# Patient Record
Sex: Male | Born: 1970 | Race: Black or African American | Hispanic: No | Smoking: Never smoker
Health system: Southern US, Community
[De-identification: ages and names within clinical notes are randomized; demographics above are authoritative.]

## PROBLEM LIST (undated history)

## (undated) DIAGNOSIS — E119 Type 2 diabetes mellitus without complications: Secondary | ICD-10-CM

## (undated) DIAGNOSIS — E785 Hyperlipidemia, unspecified: Secondary | ICD-10-CM

## (undated) DIAGNOSIS — E079 Disorder of thyroid, unspecified: Secondary | ICD-10-CM

## (undated) HISTORY — DX: Type 2 diabetes mellitus without complications: E11.9

## (undated) HISTORY — DX: Hyperlipidemia, unspecified: E78.5

## (undated) HISTORY — DX: Disorder of thyroid, unspecified: E07.9

## (undated) HISTORY — PX: EYE SURGERY: SHX253

---

## 2012-02-24 DIAGNOSIS — N529 Male erectile dysfunction, unspecified: Secondary | ICD-10-CM | POA: Insufficient documentation

## 2012-02-24 DIAGNOSIS — E785 Hyperlipidemia, unspecified: Secondary | ICD-10-CM | POA: Insufficient documentation

## 2013-03-20 ENCOUNTER — Encounter: Payer: BC Managed Care – PPO | Attending: Family Medicine | Admitting: *Deleted

## 2013-03-20 ENCOUNTER — Encounter: Payer: Self-pay | Admitting: *Deleted

## 2013-03-20 VITALS — Ht 72.0 in | Wt 181.3 lb

## 2013-03-20 DIAGNOSIS — E119 Type 2 diabetes mellitus without complications: Secondary | ICD-10-CM | POA: Insufficient documentation

## 2013-03-20 DIAGNOSIS — Z713 Dietary counseling and surveillance: Secondary | ICD-10-CM | POA: Insufficient documentation

## 2013-03-20 DIAGNOSIS — IMO0001 Reserved for inherently not codable concepts without codable children: Secondary | ICD-10-CM

## 2013-03-20 NOTE — Patient Instructions (Addendum)
Plan:  Aim for 4 Carb Choices per meal (60 grams) +/- 1 either way  Aim for 0-2 Carbs per snack if hungry  Continue with your activity level daily as tolerated Consider taking your Lantus at same time in the evenings Check with your MD about taking Humalog to correct high BGs.

## 2013-03-20 NOTE — Progress Notes (Signed)
  Medical Nutrition Therapy:  Appt start time: 1700 end time:  1800.  Assessment:  Primary concerns today: diabetes. Here with his wife Tiffany. Up at 3 AM, works from 4 AM to 3 PM, then works around [employer], ends his day by 7 PM. Wife shops and prepares the meals.  Daughter lives there too, and enjoys cooking. SMBG occasionally, stated typical range is between 46 - 480 mg/dl. Very physically active with his work. States no previous diabetes education.  MEDICATIONS: see list   DIETARY INTAKE:  Usual eating pattern includes 1-2 meals and 0-1 snacks per day.  Everyday foods include good variety of all food groups     Avoided foods include usually fried foods.    24-hr recall:  B ( AM): uses crackers, fruit and soda to try and prevent low BG at work when he takes his medicine  Snk ( AM): none  L ( PM): brings or buys: left overs meat, starch, vegetables, OR fast food burger or chicken or sub, small fries, sweet tea Snk ( PM): not unless low BG D ( PM): meat, starch, vegetable usually, water, sweet tea, diet soda Snk ( PM): none Beverages: anything  Usual physical activity: very active with work as Geneticist, molecular  Estimated energy needs: 2000 calories 225 g carbohydrates 150 g protein 56 g fat  Progress Towards Goal(s):  In progress.   Nutritional Diagnosis:  NB-1.1 Food and nutrition-related knowledge deficit As related to control of diabetes.  As evidenced by A1c of 12.5% on 11/01/2012.    Intervention:  Nutrition counseling and diabetes education initiated. Discussed basic physiology of diabetes, SMBG and rationale of checking BG at alternate times of day, A1c, Carb Counting and reading food labels, and benefits of increased activity. Also discussed insulin action and recommended he take his Lantus at about the same time each evening for more stable BG control. Also suggest he discuss adding Humalog insulin at meal time to bring post prandial BGs down. Plan:  Aim for 4 Carb  Choices per meal (60 grams) +/- 1 either way  Aim for 0-2 Carbs per snack if hungry  Continue with your activity level daily as tolerated Consider taking your Lantus at same time in the evenings Check with your MD about taking Humalog to correct high BGs.   Handouts given during visit include: Living Well with Diabetes Carb Counting and Food Label handouts Meal Plan Card  Insulin Action handout  Monitoring/Evaluation:  Dietary intake, exercise, SMBG, and body weight in 5 week(s).

## 2013-03-29 ENCOUNTER — Encounter: Payer: Self-pay | Admitting: *Deleted

## 2013-04-28 ENCOUNTER — Ambulatory Visit: Payer: BC Managed Care – PPO | Admitting: *Deleted

## 2014-01-20 ENCOUNTER — Encounter (HOSPITAL_COMMUNITY): Payer: Self-pay | Admitting: Emergency Medicine

## 2014-01-20 ENCOUNTER — Emergency Department (HOSPITAL_COMMUNITY)
Admission: EM | Admit: 2014-01-20 | Discharge: 2014-01-20 | Disposition: A | Discharge status: Home / Self Care | Payer: BC Managed Care – PPO | Attending: Emergency Medicine | Admitting: Emergency Medicine

## 2014-01-20 DIAGNOSIS — Z794 Long term (current) use of insulin: Secondary | ICD-10-CM | POA: Insufficient documentation

## 2014-01-20 DIAGNOSIS — S46909A Unspecified injury of unspecified muscle, fascia and tendon at shoulder and upper arm level, unspecified arm, initial encounter: Secondary | ICD-10-CM | POA: Insufficient documentation

## 2014-01-20 DIAGNOSIS — IMO0002 Reserved for concepts with insufficient information to code with codable children: Secondary | ICD-10-CM | POA: Insufficient documentation

## 2014-01-20 DIAGNOSIS — Z79899 Other long term (current) drug therapy: Secondary | ICD-10-CM | POA: Insufficient documentation

## 2014-01-20 DIAGNOSIS — Y92410 Unspecified street and highway as the place of occurrence of the external cause: Secondary | ICD-10-CM | POA: Insufficient documentation

## 2014-01-20 DIAGNOSIS — E785 Hyperlipidemia, unspecified: Secondary | ICD-10-CM | POA: Insufficient documentation

## 2014-01-20 DIAGNOSIS — E119 Type 2 diabetes mellitus without complications: Secondary | ICD-10-CM | POA: Insufficient documentation

## 2014-01-20 DIAGNOSIS — E079 Disorder of thyroid, unspecified: Secondary | ICD-10-CM | POA: Insufficient documentation

## 2014-01-20 DIAGNOSIS — T148XXA Other injury of unspecified body region, initial encounter: Secondary | ICD-10-CM

## 2014-01-20 DIAGNOSIS — R110 Nausea: Secondary | ICD-10-CM | POA: Insufficient documentation

## 2014-01-20 DIAGNOSIS — Y9389 Activity, other specified: Secondary | ICD-10-CM | POA: Insufficient documentation

## 2014-01-20 DIAGNOSIS — S4980XA Other specified injuries of shoulder and upper arm, unspecified arm, initial encounter: Secondary | ICD-10-CM | POA: Insufficient documentation

## 2014-01-20 LAB — URINALYSIS, ROUTINE W REFLEX MICROSCOPIC
Glucose, UA: 100 mg/dL — AB
HGB URINE DIPSTICK: NEGATIVE
Ketones, ur: NEGATIVE mg/dL
Leukocytes, UA: NEGATIVE
NITRITE: NEGATIVE
PROTEIN: NEGATIVE mg/dL
SPECIFIC GRAVITY, URINE: 1.023 (ref 1.005–1.030)
UROBILINOGEN UA: 1 mg/dL (ref 0.0–1.0)
pH: 5 (ref 5.0–8.0)

## 2014-01-20 MED ORDER — OXYCODONE-ACETAMINOPHEN 5-325 MG PO TABS
1.0000 | ORAL_TABLET | Freq: Once | ORAL | Status: AC
  Administered 2014-01-20: 1 via ORAL
  Filled 2014-01-20: qty 1

## 2014-01-20 MED ORDER — IBUPROFEN 800 MG PO TABS: 800.0000 mg | ORAL_TABLET | Freq: Three times a day (TID) | ORAL | Status: DC

## 2014-01-20 MED ORDER — CYCLOBENZAPRINE HCL 10 MG PO TABS: 10.0000 mg | ORAL_TABLET | Freq: Two times a day (BID) | ORAL | Status: DC | PRN

## 2014-01-20 MED ORDER — OXYCODONE-ACETAMINOPHEN 5-325 MG PO TABS: 1.0000 | ORAL_TABLET | ORAL | Status: DC | PRN
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
## 2014-01-25 DIAGNOSIS — E785 Hyperlipidemia, unspecified: Secondary | ICD-10-CM | POA: Insufficient documentation

## 2014-01-25 DIAGNOSIS — E079 Disorder of thyroid, unspecified: Secondary | ICD-10-CM | POA: Insufficient documentation

## 2014-01-25 DIAGNOSIS — S335XXA Sprain of ligaments of lumbar spine, initial encounter: Secondary | ICD-10-CM | POA: Insufficient documentation

## 2014-01-25 DIAGNOSIS — E119 Type 2 diabetes mellitus without complications: Secondary | ICD-10-CM | POA: Insufficient documentation

## 2014-01-25 DIAGNOSIS — Z794 Long term (current) use of insulin: Secondary | ICD-10-CM | POA: Insufficient documentation

## 2014-01-25 DIAGNOSIS — Y92410 Unspecified street and highway as the place of occurrence of the external cause: Secondary | ICD-10-CM | POA: Insufficient documentation

## 2014-01-25 DIAGNOSIS — Y9389 Activity, other specified: Secondary | ICD-10-CM | POA: Insufficient documentation

## 2014-01-25 DIAGNOSIS — Z79899 Other long term (current) drug therapy: Secondary | ICD-10-CM | POA: Insufficient documentation
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
## 2014-01-26 ENCOUNTER — Emergency Department (HOSPITAL_COMMUNITY)
Admission: EM | Admit: 2014-01-26 | Discharge: 2014-01-26 | Disposition: A | Discharge status: Home / Self Care | Payer: BC Managed Care – PPO | Attending: Emergency Medicine | Admitting: Emergency Medicine

## 2014-01-26 ENCOUNTER — Encounter (HOSPITAL_COMMUNITY): Payer: Self-pay | Admitting: Emergency Medicine

## 2014-01-26 ENCOUNTER — Emergency Department (HOSPITAL_COMMUNITY): Payer: BC Managed Care – PPO

## 2014-01-26 DIAGNOSIS — S39012A Strain of muscle, fascia and tendon of lower back, initial encounter: Secondary | ICD-10-CM

## 2014-01-26 NOTE — Discharge Instructions (Signed)
Back Pain, Adult Low back pain is very common. About 1 in 5 people have back pain.The cause of low back pain is rarely dangerous. The pain often gets better over time.About half of people with a sudden onset of back pain feel better in just 2 weeks. About 8 in 10 people feel better by 6 weeks.  CAUSES Some common causes of back pain include:  Strain of the muscles or ligaments supporting the spine.  Wear and tear (degeneration) of the spinal discs.  Arthritis.  Direct injury to the back. DIAGNOSIS Most of the time, the direct cause of low back pain is not known.However, back pain can be treated effectively even when the exact cause of the pain is unknown.Answering your caregiver's questions about your overall health and symptoms is one of the most accurate ways to make sure the cause of your pain is not dangerous. If your caregiver needs more information, he or she may order lab work or imaging tests (X-rays or MRIs).However, even if imaging tests show changes in your back, this usually does not require surgery. HOME CARE INSTRUCTIONS For many people, back pain returns.Since low back pain is rarely dangerous, it is often a condition that people can learn to manageon their own.   Remain active. It is stressful on the back to sit or stand in one place. Do not sit, drive, or stand in one place for more than 30 minutes at a time. Take short walks on level surfaces as soon as pain allows.Try to increase the length of time you walk each day.  Do not stay in bed.Resting more than 1 or 2 days can delay your recovery.  Do not avoid exercise or work.Your body is made to move.It is not dangerous to be active, even though your back may hurt.Your back will likely heal faster if you return to being active before your pain is gone.  Pay attention to your body when you bend and lift. Many people have less discomfortwhen lifting if they bend their knees, keep the load close to their bodies,and  avoid twisting. Often, the most comfortable positions are those that put less stress on your recovering back.  Find a comfortable position to sleep. Use a firm mattress and lie on your side with your knees slightly bent. If you lie on your back, put a pillow under your knees.  Only take over-the-counter or prescription medicines as directed by your caregiver. Over-the-counter medicines to reduce pain and inflammation are often the most helpful.Your caregiver may prescribe muscle relaxant drugs.These medicines help dull your pain so you can more quickly return to your normal activities and healthy exercise.  Put ice on the injured area.  Put ice in a plastic bag.  Place a towel between your skin and the bag.  Leave the ice on for 15-20 minutes, 03-04 times a day for the first 2 to 3 days. After that, ice and heat may be alternated to reduce pain and spasms.  Ask your caregiver about trying back exercises and gentle massage. This may be of some benefit.  Avoid feeling anxious or stressed.Stress increases muscle tension and can worsen back pain.It is important to recognize when you are anxious or stressed and learn ways to manage it.Exercise is a great option. SEEK MEDICAL CARE IF:  You have pain that is not relieved with rest or medicine.  You have pain that does not improve in 1 week.  You have new symptoms.  You are generally not feeling well. SEEK   IMMEDIATE MEDICAL CARE IF:   You have pain that radiates from your back into your legs.  You develop new bowel or bladder control problems.  You have unusual weakness or numbness in your arms or legs.  You develop nausea or vomiting.  You develop abdominal pain.  You feel faint. Document Released: 08/21/2005 Document Revised: 02/20/2012 Document Reviewed: 01/09/2011 [employer] Patient Information 2014 [employer].  
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
## 2014-03-27 ENCOUNTER — Ambulatory Visit: Payer: BC Managed Care – PPO | Admitting: Physician Assistant

## 2014-05-14 ENCOUNTER — Encounter: Payer: Self-pay | Admitting: Physician Assistant

## 2014-05-14 ENCOUNTER — Ambulatory Visit (INDEPENDENT_AMBULATORY_CARE_PROVIDER_SITE_OTHER): Payer: BC Managed Care – PPO | Admitting: Physician Assistant

## 2014-05-14 VITALS — BP 120/80 | HR 84 | Temp 98.1°F | Resp 18 | Ht 70.25 in | Wt 177.6 lb

## 2014-05-14 DIAGNOSIS — IMO0001 Reserved for inherently not codable concepts without codable children: Secondary | ICD-10-CM

## 2014-05-14 DIAGNOSIS — E1165 Type 2 diabetes mellitus with hyperglycemia: Principal | ICD-10-CM

## 2014-05-14 DIAGNOSIS — E079 Disorder of thyroid, unspecified: Secondary | ICD-10-CM

## 2014-05-14 DIAGNOSIS — Z23 Encounter for immunization: Secondary | ICD-10-CM

## 2014-05-14 DIAGNOSIS — I10 Essential (primary) hypertension: Secondary | ICD-10-CM

## 2014-05-14 LAB — LDL CHOLESTEROL, DIRECT: LDL DIRECT: 124.6 mg/dL

## 2014-05-14 LAB — COMPLETE METABOLIC PANEL WITH GFR
ALT: 12 U/L (ref 0–53)
AST: 12 U/L (ref 0–37)
Albumin: 4.4 g/dL (ref 3.5–5.2)
Alkaline Phosphatase: 53 U/L (ref 39–117)
BILIRUBIN TOTAL: 0.4 mg/dL (ref 0.2–1.2)
BUN: 19 mg/dL (ref 6–23)
CO2: 28 mEq/L (ref 19–32)
Calcium: 9.7 mg/dL (ref 8.4–10.5)
Chloride: 99 mEq/L (ref 96–112)
Creat: 1.33 mg/dL (ref 0.50–1.35)
GFR, EST NON AFRICAN AMERICAN: 66 mL/min
GFR, Est African American: 76 mL/min
GLUCOSE: 261 mg/dL — AB (ref 70–99)
POTASSIUM: 5.3 meq/L (ref 3.5–5.3)
Sodium: 137 mEq/L (ref 135–145)
Total Protein: 7 g/dL (ref 6.0–8.3)

## 2014-05-14 LAB — CBC WITH DIFFERENTIAL/PLATELET
BASOS ABS: 0 10*3/uL (ref 0.0–0.1)
Basophils Relative: 0.7 % (ref 0.0–3.0)
EOS ABS: 0.1 10*3/uL (ref 0.0–0.7)
Eosinophils Relative: 2 % (ref 0.0–5.0)
HEMATOCRIT: 41.3 % (ref 39.0–52.0)
HEMOGLOBIN: 13.7 g/dL (ref 13.0–17.0)
LYMPHS ABS: 1.9 10*3/uL (ref 0.7–4.0)
Lymphocytes Relative: 37.2 % (ref 12.0–46.0)
MCHC: 33.1 g/dL (ref 30.0–36.0)
MCV: 83.2 fl (ref 78.0–100.0)
MONO ABS: 0.4 10*3/uL (ref 0.1–1.0)
Monocytes Relative: 8.1 % (ref 3.0–12.0)
Neutro Abs: 2.7 10*3/uL (ref 1.4–7.7)
Neutrophils Relative %: 52 % (ref 43.0–77.0)
PLATELETS: 332 10*3/uL (ref 150.0–400.0)
RBC: 4.97 Mil/uL (ref 4.22–5.81)
RDW: 14.7 % (ref 11.5–15.5)
WBC: 5.2 10*3/uL (ref 4.0–10.5)

## 2014-05-14 LAB — LIPID PANEL
CHOL/HDL RATIO: 5
CHOLESTEROL: 172 mg/dL (ref 0–200)
HDL: 34.8 mg/dL — AB (ref >39.00)
NonHDL: 137.2
TRIGLYCERIDES: 204 mg/dL — AB (ref 0.0–149.0)
VLDL: 40.8 mg/dL — AB (ref 0.0–40.0)

## 2014-05-14 LAB — HEMOGLOBIN A1C: Hgb A1c MFr Bld: 10.5 % — ABNORMAL HIGH (ref 4.6–6.5)

## 2014-05-14 LAB — TSH: TSH: 0.2 u[IU]/mL — ABNORMAL LOW (ref 0.35–4.50)

## 2014-05-14 MED ORDER — ENALAPRIL MALEATE 5 MG PO TABS: 5.0000 mg | ORAL_TABLET | Freq: Every day | ORAL | Status: DC

## 2014-05-14 NOTE — Progress Notes (Signed)
Subjective:    Patient ID: Christopher Horne, male    DOB: 1970-09-08, 43 y.o.   MRN: [id number]  HPI Patient presents to clinic today to establish care. Previous PCP at [facility].  Acute Concerns: Medication refills and updated lab work.  Chronic Issues: DMII- Currently taking metformin 522m BID, and Lantus 52 units at bedtime, and Glipizide BID. Unsure of what his sugars have been running. Last a1c documented was in 12/2012 and was 10.4%, however the pt states that he has had a more recent A1c which was closer to 8%. Pt's wife has also been concerned about the pt cutting his own toenails since he has diabetes.  HLD- on treatment with lovastatin. He states he is tolerating this well. He denies any adverse effects to treatment.  HLD- stable on enalapril. He states he is tolerating this well and denies any adverse effects to treatment.   Hypothyroid- Pt currently takes levothyroxine 575m. States he tolerates this well and denies adverse effects.  Health Maintenance: Dental -- [facility], sees every 6 months. [facility], [facility]. Immunizations -- Needs pneumonia vaccine. Colonoscopy -- No family history of colon cancer, average risk. Begin c-scope age 41-50  Review of Systems Patient denies fevers, chills, nausea, vomiting, diarrhea, chest pain, shortness of breath, orthopnea, headaches, syncope. Denies lower extremity edema, abdominal pain, change in appetite, change in bowel movements. Patient denies rashes, musculoskeletal complaints. No other specific complaints in a complete review of systems.    Past Medical History  Diagnosis Date  . Diabetes mellitus without complication   . Hyperlipidemia   . Thyroid disease     History   Social History  . Marital Status: Married    Spouse Name: N/A    Number of Children: N/A  . Years of Education: N/A   Occupational History  . Not on file.   Social History Main Topics  .  Smoking status: Never Smoker   . Smokeless tobacco: Never Used  . Alcohol Use: Yes     Comment: once a month, beer  . Drug Use: No  . Sexual Activity: Not on file   Other Topics Concern  . Not on file   Social History Narrative  . No narrative on file    History reviewed. No pertinent past surgical history.  History reviewed. No pertinent family history.  No Known Allergies  Current Outpatient Prescriptions on File Prior to Visit  Medication Sig Dispense Refill  . acetaminophen (TYLENOL) 325 MG tablet Take 650 mg by mouth every 6 (six) hours as needed for mild pain.      . Marland KitchenlipiZIDE (GLUCOTROL XL) 10 MG 24 hr tablet Take 10 mg by mouth 2 (two) times daily before a meal.      . ibuprofen (ADVIL,MOTRIN) 800 MG tablet Take 1 tablet (800 mg total) by mouth 3 (three) times daily.  21 tablet  0  . insulin glargine (LANTUS) 100 UNIT/ML injection Inject 52 Units into the skin at bedtime.       . Marland Kitchenevothyroxine (SYNTHROID, LEVOTHROID) 50 MCG tablet Take 50 mcg by mouth daily before breakfast.      . lovastatin (MEVACOR) 20 MG tablet Take 20 mg by mouth at bedtime.      . metFORMIN (GLUCOPHAGE) 1000 MG tablet Take 1,000 mg by mouth 2 (two) times daily with a meal.       No current facility-administered medications on file prior to visit.   The PFS history was reviewed with  the pt at time of visit.  EXAM: BP 120/80  Pulse 84  Temp(Src) 98.1 F (36.7 C) (Oral)  Resp 18  Ht 5' 10.25" (1.784 m)  Wt 177 lb 9.6 oz (80.559 kg)  BMI 25.31 kg/m2     Objective:   Physical Exam  Nursing note and vitals reviewed. Constitutional: He is oriented to person, place, and time. He appears well-developed and well-nourished. No distress.  HENT:  Head: Normocephalic and atraumatic.  Right Ear: External ear normal.  Left Ear: External ear normal.  Nose: Nose normal.  Mouth/Throat: Oropharynx is clear and moist. No oropharyngeal exudate.  Bilat TMs normal.  Eyes: Conjunctivae and EOM are normal.   Pt has sunglasses on, had dilated retinal exam earlier today.  Neck: Normal range of motion. Neck supple. No thyromegaly present.  Cardiovascular: Normal rate, regular rhythm, normal heart sounds and intact distal pulses.   Pulmonary/Chest: Effort normal and breath sounds normal. No stridor. No respiratory distress. He has no wheezes. He has no rales. He exhibits no tenderness.  Musculoskeletal: Normal range of motion. He exhibits no edema and no tenderness.  Foot Exam normal.  Gait normal.  Lymphadenopathy:    He has no cervical adenopathy.  Neurological: He is alert and oriented to person, place, and time.  Sensation and strength grossly intact bilaterally.  Skin: Skin is warm and dry. He is not diaphoretic. No pallor.  Psychiatric: He has a normal mood and affect. His behavior is normal. Judgment and thought content normal.     No results found for this basename: WBC, HGB, HCT, PLT, GLUCOSE, CHOL, TRIG, HDL, LDLDIRECT, LDLCALC, ALT, AST, NA, K, CL, CREATININE, BUN, CO2, TSH, PSA, INR, GLUF, HGBA1C, MICROALBUR        Assessment & Plan:  Christopher Horne was seen today for establish care.  Diagnoses and associated orders for this visit:  Uncontrolled diabetes mellitus type 2 without complications Comments: Needs referral to diabtes specialist. Obtain a1c, lipids, urine microalbumin. - Lipid Panel - Hemoglobin A1c - Microalbumin/Creatinine Ratio, Urine - Ambulatory referral to Endocrinology - CBC with Differential  Essential hypertension Comments: Stable on Enalapril. Will continue. Check CMP. - CMP with eGFR - CBC with Differential - enalapril (VASOTEC) 5 MG tablet; Take 1 tablet (5 mg total) by mouth daily.  Thyroid disease Comments: Currently taking levothyroxine 22mg daily. Check TSH. - TSH - CBC with Differential  Need for prophylactic vaccination against Streptococcus pneumoniae (pneumococcus) - Pneumococcal conjugate vaccine 13-valent    Discussed waiting to refill  medications, except enalapril, until lab studies return. Pt has sufficient supply of the current dose of these medications.  Pts foot exam was normal, however suggested podiatrist appointment as pt and wife were concerned about trimming nails.  Will have pt schedule to return for an annual physical.  Return precautions provided, and patient handout on HTN, DMII, Hypothyroid.   Plan to follow up as needed, or for worsening or persistent symptoms despite treatment.  Patient Instructions  We will call you with your lab results when available.  You'll be called to schedule an appointment with the diabetes specialist in order to make sure that we are aggressively addressing your diabetes.  You can contact a podiatrist at the number provided in order to have a foot exam since you're having difficulty with your toenails.  If emergency symptoms discussed during visit developed, seek medical attention immediately.  Followup as needed, or for worsening or persistent symptoms despite treatment.

## 2014-05-14 NOTE — Patient Instructions (Addendum)
We will call you with your lab results when available.  You'll be called to schedule an appointment with the diabetes specialist in order to make sure that we are aggressively addressing your diabetes.  You can contact a podiatrist at the number provided in order to have a foot exam since you're having difficulty with your toenails.  If emergency symptoms discussed during visit developed, seek medical attention immediately.  Followup as needed, or for worsening or persistent symptoms despite treatment.    Hypertension Hypertension is another name for high blood pressure. High blood pressure forces your heart to work harder to pump blood. A blood pressure reading has two numbers, which includes a higher number over a lower number (example: 110/72). HOME CARE   Have your blood pressure rechecked by your doctor.  Only take medicine as told by your doctor. Follow the directions carefully. The medicine does not work as well if you skip doses. Skipping doses also puts you at risk for problems.  Do not smoke.  Monitor your blood pressure at home as told by your doctor. GET HELP IF:  You think you are having a reaction to the medicine you are taking.  You have repeat headaches or feel dizzy.  You have puffiness (swelling) in your ankles.  You have trouble with your vision. GET HELP RIGHT AWAY IF:   You get a very bad headache and are confused.  You feel weak, numb, or faint.  You get chest or belly (abdominal) pain.  You throw up (vomit).  You cannot breathe very well. MAKE SURE YOU:   Understand these instructions.  Will watch your condition.  Will get help right away if you are not doing well or get worse. Document Released: 02/07/2008 Document Revised: 08/26/2013 Document Reviewed: 06/13/2013 [facility] Patient Information 2015 [place], [place]. This information is not intended to replace advice given to you by your health care provider. Make sure you discuss any questions  you have with your health care provider. Type 2 Diabetes Mellitus Type 2 diabetes mellitus is a long-term (chronic) disease. In type 2 diabetes:  The pancreas does not make enough of a hormone called insulin.  The cells in the body do not respond as well to the insulin that is made.  Both of the above can happen. Normally, insulin moves sugars from food into tissue cells. This gives you energy. If you have type 2 diabetes, sugars cannot be moved into tissue cells. This causes high blood sugar (hyperglycemia).  HOME CARE  Have your hemoglobin A1c level checked twice a year. The level shows if your diabetes is under control or out of control.  Test your blood sugar level every day as told by your doctor.  Check your ketone levels by testing your pee (urine) when you are sick and as told.  Take your diabetes or insulin medicine as told by your doctor.  Never run out of insulin.  Adjust how much insulin you give yourself based on how many carbs (carbohydrates) you eat. Carbs are in many foods, such as fruits, vegetables, whole grains, and dairy products.  Have a healthy snack between every healthy meal. Have 3 meals and 3 snacks a day.  Lose weight if you are overweight.  Carry a medical alert card or wear your medical alert jewelry.  Carry a 15-gram carb snack with you at all times. Examples include:  Glucose pills, 3 or 4.  Glucose gel, 15-gram tube.  Raisins, 2 tablespoons (24 grams).  Jelly beans, 6.  Animal crackers, 8.  Regular (not diet) pop, 4 ounces (120 milliliters).  Gummy treats, 9.  Notice low blood sugar (hypoglycemia) symptoms, such as:  Shaking (tremors).  Trouble thinking clearly.  Sweating.  Faster heart rate.  Headache.  Dry mouth.  Hunger.  Crabbiness (irritability).  Being worried or tense (anxious).  Restless sleep.  A change in speech or coordination.  Confusion.  Treat low blood sugar right away. If you are alert and can  swallow, follow the 15:15 rule:  Take 15-20 grams of a rapid-acting glucose or carb. This includes glucose gel, glucose pills, or 4 ounces (120 milliliters) of fruit juice, regular pop, or low-fat milk.  Check your blood sugar level 15 minutes after taking the glucose.  Take 15-20 grams more of glucose if the repeat blood sugar level is still 70 mg/dL (milligrams/deciliter) or below.  Eat a meal or snack within 1 hour of the blood sugar levels going back to normal.  Notice early symptoms of high blood sugar, such as:  Being really thirsty or drinking a lot (polydipsia).  Peeing a lot (polyuria).  Do at least 150 minutes of physical activity a week or as told.  Split the 150 minutes of activity up during the week. Do not do 150 minutes of activity in one day.  Perform exercises, such as weight lifting, at least 2 times a week or as told.  Spend no more than 90 minutes at one time inactive.  Adjust your insulin or food intake as needed if you start a new exercise or sport.  Follow your sick-day plan when you are not able to eat or drink as usual.  Do not smoke, chew tobacco, or use electronic cigarettes.  Women who are not pregnant should drink no more than 1 drink a day. Men should drink no more than 2 drinks a day.  Only drink alcohol with food.  Ask your doctor if alcohol is safe for you.  Tell your doctor if you drink alcohol several times during the week.  See your doctor regularly.  Schedule an eye exam soon after you are told you have diabetes. Schedule exams once every year.  Check your skin and feet every day. Check for cuts, bruises, redness, nail problems, bleeding, blisters, or sores. A doctor should do a foot exam once a year.  Brush your teeth and gums twice a day. Floss once a day. Visit your dentist regularly.  Share your diabetes plan with your workplace or school.  Stay up-to-date with shots that fight against diseases (immunizations).  Learn how to  deal with stress.  Get diabetes education and support as needed.  Ask your doctor for special help if:  You need help to maintain or improve how you do things on your own.  You need help to maintain or improve the quality of your life.  You have foot or hand problems.  You have trouble cleaning yourself, dressing, eating, or doing physical activity. GET HELP IF:  You are unable to eat or drink for more than 6 hours.  You feel sick to your stomach (nauseous) or throw up (vomit) for more than 6 hours.  Your blood sugar level is over 240 mg/dL.  There is a change in mental status.  You get another serious illness.  You have watery poop (diarrhea) for more than 6 hours.  You have been sick or have had a fever for 2 or more days and are not getting better.  You have pain when you  are active. GET HELP RIGHT AWAY IF:  You have trouble breathing.  Your ketone levels are higher than your doctor says they should be. MAKE SURE YOU:  Understand these instructions.  Will watch your condition.  Will get help right away if you are not doing well or get worse. Document Released: 05/30/2008 Document Revised: 01/05/2014 Document Reviewed: 03/22/2012 [facility] Patient Information 2015 [place], [place]. This information is not intended to replace advice given to you by your health care provider. Make sure you discuss any questions you have with your health care provider. Hypothyroidism The thyroid is a large gland located in the lower front of your neck. The thyroid gland helps control metabolism. Metabolism is how your body handles food. It controls metabolism with the hormone thyroxine. When this gland is underactive (hypothyroid), it produces too little hormone.  CAUSES These include:   Absence or destruction of thyroid tissue.  Goiter due to iodine deficiency.  Goiter due to medications.  Congenital defects (since birth).  Problems with the pituitary. This causes a lack of TSH  (thyroid stimulating hormone). This hormone tells the thyroid to turn out more hormone. SYMPTOMS  Lethargy (feeling as though you have no energy)  Cold intolerance  Weight gain (in spite of normal food intake)  Dry skin  Coarse hair  Menstrual irregularity (if severe, may lead to infertility)  Slowing of thought processes Cardiac problems are also caused by insufficient amounts of thyroid hormone. Hypothyroidism in the newborn is cretinism, and is an extreme form. It is important that this form be treated adequately and immediately or it will lead rapidly to retarded physical and mental development. DIAGNOSIS  To prove hypothyroidism, your caregiver may do blood tests and ultrasound tests. Sometimes the signs are hidden. It may be necessary for your caregiver to watch this illness with blood tests either before or after diagnosis and treatment. TREATMENT  Low levels of thyroid hormone are increased by using synthetic thyroid hormone. This is a safe, effective treatment. It usually takes about four weeks to gain the full effects of the medication. After you have the full effect of the medication, it will generally take another four weeks for problems to leave. Your caregiver may start you on low doses. If you have had heart problems the dose may be gradually increased. It is generally not an emergency to get rapidly to normal. HOME CARE INSTRUCTIONS   Take your medications as your caregiver suggests. Let your caregiver know of any medications you are taking or start taking. Your caregiver will help you with dosage schedules.  As your condition improves, your dosage needs may increase. It will be necessary to have continuing blood tests as suggested by your caregiver.  Report all suspected medication side effects to your caregiver. SEEK MEDICAL CARE IF: Seek medical care if you develop:  Sweating.  Tremulousness (tremors).  Anxiety.  Rapid weight loss.  Heat  intolerance.  Emotional swings.  Diarrhea.  Weakness. SEEK IMMEDIATE MEDICAL CARE IF:  You develop chest pain, an irregular heart beat (palpitations), or a rapid heart beat. MAKE SURE YOU:   Understand these instructions.  Will watch your condition.  Will get help right away if you are not doing well or get worse. Document Released: 08/21/2005 Document Revised: 11/13/2011 Document Reviewed: 04/10/2008 [facility] Patient Information 2015 [place], [place]. This information is not intended to replace advice given to you by your health care provider. Make sure you discuss any questions you have with your health care provider.

## 2014-05-15 ENCOUNTER — Other Ambulatory Visit: Payer: Self-pay | Admitting: Physician Assistant

## 2014-05-15 DIAGNOSIS — E039 Hypothyroidism, unspecified: Secondary | ICD-10-CM

## 2014-05-15 LAB — MICROALBUMIN / CREATININE URINE RATIO
Creatinine,U: 249.8 mg/dL
Microalb Creat Ratio: 3.5 mg/g (ref 0.0–30.0)
Microalb, Ur: 8.7 mg/dL — ABNORMAL HIGH (ref 0.0–1.9)

## 2014-05-15 MED ORDER — LEVOTHYROXINE SODIUM 25 MCG PO TABS: 25.0000 ug | ORAL_TABLET | Freq: Every day | ORAL | Status: DC

## 2014-05-26 ENCOUNTER — Ambulatory Visit: Payer: BC Managed Care – PPO | Admitting: Endocrinology

## 2014-06-01 ENCOUNTER — Ambulatory Visit (INDEPENDENT_AMBULATORY_CARE_PROVIDER_SITE_OTHER): Payer: BC Managed Care – PPO | Admitting: Endocrinology

## 2014-06-01 ENCOUNTER — Encounter: Payer: Self-pay | Admitting: Endocrinology

## 2014-06-01 VITALS — BP 128/74 | HR 88 | Temp 98.5°F | Ht 70.25 in | Wt 183.0 lb

## 2014-06-01 DIAGNOSIS — E109 Type 1 diabetes mellitus without complications: Secondary | ICD-10-CM

## 2014-06-01 DIAGNOSIS — E039 Hypothyroidism, unspecified: Secondary | ICD-10-CM

## 2014-06-01 DIAGNOSIS — I10 Essential (primary) hypertension: Secondary | ICD-10-CM

## 2014-06-01 DIAGNOSIS — E785 Hyperlipidemia, unspecified: Secondary | ICD-10-CM | POA: Insufficient documentation

## 2014-06-01 NOTE — Progress Notes (Signed)
Subjective:    Patient ID: Christopher Horne, male    DOB: 09-02-1971, 43 y.o.   MRN: [id number]  HPI History is from pt and wife.  pt states DM was dx'ed in 2012; he has mild if any neuropathy of the lower extremities, but he has associated retinopathy; he has been on insulin since dx; pt says his diet and exercise are good; he had severe hypoglycemia once, in may of 2015.  He has never had pancreatitis or DKA. Wife says pt never misses the insulin, but it is expensive.  He seldom checks cbg's, but when he does, it varies from 50-160.  It is in general higher as the day goes on.   Past Medical History  Diagnosis Date  . Diabetes mellitus without complication   . Hyperlipidemia   . Thyroid disease     No past surgical history on file.  History   Social History  . Marital Status: Married    Spouse Name: N/A    Number of Children: N/A  . Years of Education: N/A   Occupational History  . Not on file.   Social History Main Topics  . Smoking status: Never Smoker   . Smokeless tobacco: Never Used  . Alcohol Use: Yes     Comment: once a month, beer  . Drug Use: No  . Sexual Activity: Not on file   Other Topics Concern  . Not on file   Social History Narrative  . No narrative on file    Current Outpatient Prescriptions on File Prior to Visit  Medication Sig Dispense Refill  . acetaminophen (TYLENOL) 325 MG tablet Take 650 mg by mouth every 6 (six) hours as needed for mild pain.      Marland Kitchen enalapril (VASOTEC) 5 MG tablet Take 1 tablet (5 mg total) by mouth daily.  30 tablet  3  . ibuprofen (ADVIL,MOTRIN) 800 MG tablet Take 1 tablet (800 mg total) by mouth 3 (three) times daily.  21 tablet  0  . insulin glargine (LANTUS) 100 UNIT/ML injection Inject 52 Units into the skin every morning.       Marland Kitchen levothyroxine (SYNTHROID, LEVOTHROID) 25 MCG tablet Take 1 tablet (25 mcg total) by mouth daily before breakfast.  30 tablet  3  . lovastatin (MEVACOR) 20 MG tablet Take 20 mg by mouth at  bedtime.       No current facility-administered medications on file prior to visit.    No Known Allergies  Family History  Problem Relation Age of Onset  . Diabetes Maternal Grandmother   . Diabetes Paternal Grandmother     BP 128/74  Pulse 88  Temp(Src) 98.5 F (36.9 C) (Oral)  Ht 5' 10.25" (1.784 m)  Wt 183 lb (83.008 kg)  BMI 26.08 kg/m2  SpO2 97%    Review of Systems denies headache, chest pain, sob, n/v, excessive diaphoresis, depression, cold intolerance, rhinorrhea, and easy bruising.  He has blurry vision, urinary frequency, leg cramps, and weight loss.     Objective:   Physical Exam VS: see vs page. GEN: no distress. HEAD: head: no deformity. eyes: no periorbital swelling, no proptosis external nose and ears are normal mouth: no lesion seen NECK: supple, thyroid is not enlarged CHEST WALL: no deformity. LUNGS: clear to auscultation. BREASTS:  No gynecomastia. CV: reg rate and rhythm, no murmur. ABD: abdomen is soft, nontender.  no hepatosplenomegaly.  not distended.  no hernia.   MUSCULOSKELETAL: muscle bulk and strength are grossly normal.  no obvious  joint swelling.  gait is normal and steady EXTEMITIES: no deformity.  no ulcer on the feet.  feet are of normal color and temp.  no edema.  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   PULSES: dorsalis pedis intact bilat.  no carotid bruit NEURO:  cn 2-12 grossly intact.   readily moves all 4's.  sensation is intact to touch on the feet SKIN:  Normal texture and temperature.  No rash or suspicious lesion is visible.   NODES:  None palpable at the neck.   PSYCH: alert, well-oriented.  Does not appear anxious nor depressed.     Lab Results  Component Value Date   HGBA1C 10.5* 05/14/2014   i have reviewed the following outside records: Office notes.    Assessment & Plan:  DM: new to me.  He needs increased rx.    Patient is advised the following: Patient Instructions  good diet and exercise habits significanly  improve the control of your diabetes.  please let me know if you wish to be referred to a dietician.  high blood sugar is very risky to your health.  you should see an eye doctor and dentist every year.  You are at higher than average risk for pneumonia and hepatitis-B.  You should be vaccinated against both.   controlling your blood pressure and cholesterol drastically reduces the damage diabetes does to your body.  this also applies to quitting smoking.  please discuss these with your doctor.  check your blood sugar twice a day.  vary the time of day when you check, between before the 3 meals, and at bedtime.  also check if you have symptoms of your blood sugar being too high or too low.  please keep a record of the readings and bring it to your next appointment here.  You can write it on any piece of paper.  please call us sooner if your blood sugar goes below 70, or if you have a lot of readings over 200.  Please stop taking the metformin and glipizide.   For now, please change the lantus to the morning.   Please come back for a follow-up appointment in 2 weeks.   We may need to change the lantus to a faster-acting insulin.

## 2014-06-01 NOTE — Patient Instructions (Addendum)
good diet and exercise habits significanly improve the control of your diabetes.  please let me know if you wish to be referred to a dietician.  high blood sugar is very risky to your health.  you should see an eye doctor and dentist every year.  You are at higher than average risk for pneumonia and hepatitis-B.  You should be vaccinated against both.   controlling your blood pressure and cholesterol drastically reduces the damage diabetes does to your body.  this also applies to quitting smoking.  please discuss these with your doctor.  check your blood sugar twice a day.  vary the time of day when you check, between before the 3 meals, and at bedtime.  also check if you have symptoms of your blood sugar being too high or too low.  please keep a record of the readings and bring it to your next appointment here.  You can write it on any piece of paper.  please call us sooner if your blood sugar goes below 70, or if you have a lot of readings over 200.  Please stop taking the metformin and glipizide.   For now, please change the lantus to the morning.   Please come back for a follow-up appointment in 2 weeks.   We may need to change the lantus to a faster-acting insulin.

## 2014-06-11 ENCOUNTER — Other Ambulatory Visit: Payer: Self-pay

## 2014-06-11 DIAGNOSIS — E039 Hypothyroidism, unspecified: Secondary | ICD-10-CM

## 2014-06-11 DIAGNOSIS — I10 Essential (primary) hypertension: Secondary | ICD-10-CM

## 2014-06-11 MED ORDER — ENALAPRIL MALEATE 5 MG PO TABS: 5.0000 mg | ORAL_TABLET | Freq: Every day | ORAL | Status: DC

## 2014-06-11 MED ORDER — INSULIN GLARGINE 100 UNIT/ML ~~LOC~~ SOLN: 52.0000 [IU] | SUBCUTANEOUS | Status: DC

## 2014-06-11 MED ORDER — LEVOTHYROXINE SODIUM 25 MCG PO TABS: 25.0000 ug | ORAL_TABLET | Freq: Every day | ORAL | Status: DC

## 2014-06-11 MED ORDER — LOVASTATIN 20 MG PO TABS: 20.0000 mg | ORAL_TABLET | Freq: Every day | ORAL | Status: DC

## 2014-06-11 NOTE — Telephone Encounter (Signed)
Rx sent to pharmacy per patient request. 

## 2014-06-15 ENCOUNTER — Ambulatory Visit (INDEPENDENT_AMBULATORY_CARE_PROVIDER_SITE_OTHER): Payer: BC Managed Care – PPO | Admitting: Endocrinology

## 2014-06-15 ENCOUNTER — Encounter: Payer: Self-pay | Admitting: Endocrinology

## 2014-06-15 VITALS — BP 120/60 | HR 96 | Temp 98.0°F | Ht 70.25 in | Wt 174.0 lb

## 2014-06-15 DIAGNOSIS — E10329 Type 1 diabetes mellitus with mild nonproliferative diabetic retinopathy without macular edema: Secondary | ICD-10-CM

## 2014-06-15 DIAGNOSIS — E103299 Type 1 diabetes mellitus with mild nonproliferative diabetic retinopathy without macular edema, unspecified eye: Secondary | ICD-10-CM

## 2014-06-15 DIAGNOSIS — E139 Other specified diabetes mellitus without complications: Secondary | ICD-10-CM | POA: Insufficient documentation

## 2014-06-15 DIAGNOSIS — E119 Type 2 diabetes mellitus without complications: Secondary | ICD-10-CM

## 2014-06-15 DIAGNOSIS — Z794 Long term (current) use of insulin: Secondary | ICD-10-CM

## 2014-06-15 NOTE — Progress Notes (Signed)
Subjective:    Patient ID: Christopher Horne, male    DOB: 1970-11-13, 43 y.o.   MRN: [id number]  HPI Pt returns for f/u of diabetes mellitus: DM type: Insulin-requiring type 2 Dx'ed: 2012 Complications: retinopathy Therapy: insulin since dx. DKA: never Severe hypoglycemia: once, in may of 2015.   Pancreatitis: never Other:  Interval history:  no cbg record, but states cbg's vary from 111-205.  It is in general higher as the day goes on.  He requests to stay with the qd insulin schedule.  Past Medical History  Diagnosis Date  . Diabetes mellitus without complication   . Hyperlipidemia   . Thyroid disease     No past surgical history on file.  History   Social History  . Marital Status: Married    Spouse Name: N/A    Number of Children: N/A  . Years of Education: N/A   Occupational History  . Not on file.   Social History Main Topics  . Smoking status: Never Smoker   . Smokeless tobacco: Never Used  . Alcohol Use: Yes     Comment: once a month, beer  . Drug Use: No  . Sexual Activity: Not on file   Other Topics Concern  . Not on file   Social History Narrative  . No narrative on file    Current Outpatient Prescriptions on File Prior to Visit  Medication Sig Dispense Refill  . acetaminophen (TYLENOL) 325 MG tablet Take 650 mg by mouth every 6 (six) hours as needed for mild pain.      Marland Kitchen. enalapril (VASOTEC) 5 MG tablet Take 1 tablet (5 mg total) by mouth daily.  90 tablet  1  . ibuprofen (ADVIL,MOTRIN) 800 MG tablet Take 1 tablet (800 mg total) by mouth 3 (three) times daily.  21 tablet  0  . insulin glargine (LANTUS) 100 UNIT/ML injection Inject 0.52 mLs (52 Units total) into the skin every morning.  10 mL  5  . levothyroxine (SYNTHROID, LEVOTHROID) 25 MCG tablet Take 1 tablet (25 mcg total) by mouth daily before breakfast.  90 tablet  1  . lovastatin (MEVACOR) 20 MG tablet Take 1 tablet (20 mg total) by mouth at bedtime.  90 tablet  1   No current  facility-administered medications on file prior to visit.    No Known Allergies  Family History  Problem Relation Age of Onset  . Diabetes Maternal Grandmother   . Diabetes Paternal Grandmother     BP 120/60  Pulse 96  Temp(Src) 98 F (36.7 C) (Oral)  Ht 5' 10.25" (1.784 m)  Wt 174 lb (78.926 kg)  BMI 24.80 kg/m2  SpO2 97%    Review of Systems He denies hypoglycemia and n/v    Objective:   Physical Exam VITAL SIGNS:  See vs page. GENERAL: no distress. SKIN:  Insulin injection sites at the triceps areas are normal.      Lab Results  Component Value Date   HGBA1C 10.5* 05/14/2014      Assessment & Plan:  DM: control is much better.  Patient is advised the following: Patient Instructions  check your blood sugar twice a day.  vary the time of day when you check, between before the 3 meals, and at bedtime.  also check if you have symptoms of your blood sugar being too high or too low.  please keep a record of the readings and bring it to your next appointment here.  You can write it on any piece  of paper.  please call us sooner if your blood sugar goes below 70, or if you have a lot of readings over 200.  Please continue the same insulin. Please come back for a follow-up appointment in 2 months.

## 2014-06-15 NOTE — Patient Instructions (Addendum)
check your blood sugar twice a day.  vary the time of day when you check, between before the 3 meals, and at bedtime.  also check if you have symptoms of your blood sugar being too high or too low.  please keep a record of the readings and bring it to your next appointment here.  You can write it on any piece of paper.  please call us sooner if your blood sugar goes below 70, or if you have a lot of readings over 200. Please continue the same insulin.   Please come back for a follow-up appointment in 2 months.    

## 2014-06-17 ENCOUNTER — Ambulatory Visit (INDEPENDENT_AMBULATORY_CARE_PROVIDER_SITE_OTHER): Payer: BC Managed Care – PPO | Admitting: Endocrinology

## 2014-06-17 ENCOUNTER — Telehealth: Payer: Self-pay | Admitting: Physician Assistant

## 2014-06-17 ENCOUNTER — Telehealth: Payer: Self-pay | Admitting: Endocrinology

## 2014-06-17 VITALS — BP 116/60 | HR 96 | Temp 98.6°F | Ht 70.5 in | Wt 177.0 lb

## 2014-06-17 DIAGNOSIS — E103299 Type 1 diabetes mellitus with mild nonproliferative diabetic retinopathy without macular edema, unspecified eye: Secondary | ICD-10-CM

## 2014-06-17 DIAGNOSIS — E10329 Type 1 diabetes mellitus with mild nonproliferative diabetic retinopathy without macular edema: Secondary | ICD-10-CM

## 2014-06-17 MED ORDER — INSULIN GLARGINE 100 UNIT/ML ~~LOC~~ SOLN: 65.0000 [IU] | SUBCUTANEOUS | Status: DC

## 2014-06-17 NOTE — Telephone Encounter (Signed)
See below, Pt scheduled for today at 11:15. Thanks!

## 2014-06-17 NOTE — Progress Notes (Signed)
Subjective:    Patient ID: Christopher Horne, male    DOB: Mar 19, 1971, 43 y.o.   MRN: [id number]  HPI Pt returns for f/u of diabetes mellitus: DM type: Insulin-requiring type 2 Dx'ed: 2012 Complications: retinopathy Therapy: insulin since dx. DKA: never Severe hypoglycemia: once, in may of 2015.   Pancreatitis: never Other: pt has chosen a qd insulin regimen  Interval history:  no cbg record, but states over the past 2 days, cbg's have varied from 200-300's.  He is unable to say what might caused this.  He had retinal photocoagulation of the right retina, but no fever or steroid injection.   Past Medical History  Diagnosis Date  . Diabetes mellitus without complication   . Hyperlipidemia   . Thyroid disease     No past surgical history on file.  History   Social History  . Marital Status: Married    Spouse Name: N/A    Number of Children: N/A  . Years of Education: N/A   Occupational History  . Not on file.   Social History Main Topics  . Smoking status: Never Smoker   . Smokeless tobacco: Never Used  . Alcohol Use: Yes     Comment: once a month, beer  . Drug Use: No  . Sexual Activity: Not on file   Other Topics Concern  . Not on file   Social History Narrative  . No narrative on file    Current Outpatient Prescriptions on File Prior to Visit  Medication Sig Dispense Refill  . acetaminophen (TYLENOL) 325 MG tablet Take 650 mg by mouth every 6 (six) hours as needed for mild pain.      Marland Kitchen. enalapril (VASOTEC) 5 MG tablet Take 1 tablet (5 mg total) by mouth daily.  90 tablet  1  . ibuprofen (ADVIL,MOTRIN) 800 MG tablet Take 1 tablet (800 mg total) by mouth 3 (three) times daily.  21 tablet  0  . levothyroxine (SYNTHROID, LEVOTHROID) 25 MCG tablet Take 1 tablet (25 mcg total) by mouth daily before breakfast.  90 tablet  1  . lovastatin (MEVACOR) 20 MG tablet Take 1 tablet (20 mg total) by mouth at bedtime.  90 tablet  1   No current facility-administered  medications on file prior to visit.    No Known Allergies  Family History  Problem Relation Age of Onset  . Diabetes Maternal Grandmother   . Diabetes Paternal Grandmother     BP 116/60  Pulse 96  Temp(Src) 98.6 F (37 C) (Oral)  Ht 5' 10.5" (1.791 m)  Wt 177 lb (80.287 kg)  BMI 25.03 kg/m2  SpO2 96%    Review of Systems Denies n/v and hypoglycemia.      Objective:   Physical Exam VITAL SIGNS:  See vs page.  GENERAL: no distress.  Gait: normal and steady.       Assessment & Plan:  DM: moderate exacerbation, uncertain etiology.  Patient is advised the following: Patient Instructions  check your blood sugar twice a day.  vary the time of day when you check, between before the 3 meals, and at bedtime.  also check if you have symptoms of your blood sugar being too high or too low.  please keep a record of the readings and bring it to your next appointment here.  You can write it on any piece of paper.  please call us sooner if your blood sugar goes below 70, or if you have a lot of readings over 200.  Please increase the lantus to 65 units daily. I hope you feel better soon.  If your blood sugar doesn't get better by next week, please call back.  Please call sooner if you get worse. Please come back for a follow-up appointment in 2 months.

## 2014-06-17 NOTE — Patient Instructions (Addendum)
check your blood sugar twice a day.  vary the time of day when you check, between before the 3 meals, and at bedtime.  also check if you have symptoms of your blood sugar being too high or too low.  please keep a record of the readings and bring it to your next appointment here.  You can write it on any piece of paper.  please call us sooner if your blood sugar goes below 70, or if you have a lot of readings over 200.  Please increase the lantus to 65 units daily. I hope you feel better soon.  If your blood sugar doesn't get better by next week, please call back.  Please call sooner if you get worse. Please come back for a follow-up appointment in 2 months.

## 2014-06-17 NOTE — Telephone Encounter (Signed)
Rx sent to pharmacy.  Pt's wife is advised that rx has been changed.

## 2014-06-17 NOTE — Telephone Encounter (Signed)
Caller: Tiffany/Spouse; Phone: [phone]; Reason for Call: Wife states husband saw his Endocrinologist today and his Lantus dose was increased to 65 units.  Wife concerned that the Lantus pens are going to be running out too quick and there won't be adequate refills or be able to get refills.   Assured her will send message and someone will f/u on this.

## 2014-06-17 NOTE — Telephone Encounter (Signed)
Wife said her husband sugar has been very high and he has been very sick his sugar has been 363 this morning it was 263, been throwing up last night [phone] is the number to reach him

## 2014-07-29 ENCOUNTER — Ambulatory Visit: Payer: BC Managed Care – PPO | Admitting: Family

## 2014-08-10 ENCOUNTER — Telehealth: Payer: Self-pay | Admitting: Physician Assistant

## 2014-08-10 NOTE — Telephone Encounter (Signed)
Pt would like to change pharm to [facility] [facility]

## 2014-08-10 NOTE — Telephone Encounter (Signed)
We have never seen this pt, however I did change the pharmacy

## 2014-08-17 ENCOUNTER — Ambulatory Visit: Payer: BC Managed Care – PPO | Admitting: Endocrinology

## 2014-08-17 ENCOUNTER — Ambulatory Visit: Payer: BC Managed Care – PPO | Admitting: Family

## 2014-10-02 ENCOUNTER — Ambulatory Visit (INDEPENDENT_AMBULATORY_CARE_PROVIDER_SITE_OTHER): Payer: BLUE CROSS/BLUE SHIELD | Admitting: Endocrinology

## 2014-10-02 ENCOUNTER — Ambulatory Visit: Payer: BC Managed Care – PPO | Admitting: Family

## 2014-10-02 ENCOUNTER — Encounter: Payer: Self-pay | Admitting: Endocrinology

## 2014-10-02 VITALS — BP 132/82 | HR 89 | Temp 98.1°F | Ht 70.5 in | Wt 194.0 lb

## 2014-10-02 DIAGNOSIS — E103299 Type 1 diabetes mellitus with mild nonproliferative diabetic retinopathy without macular edema, unspecified eye: Secondary | ICD-10-CM

## 2014-10-02 DIAGNOSIS — E10329 Type 1 diabetes mellitus with mild nonproliferative diabetic retinopathy without macular edema: Secondary | ICD-10-CM

## 2014-10-02 LAB — HEMOGLOBIN A1C: HEMOGLOBIN A1C: 12.9 % — AB (ref 4.6–6.5)

## 2014-10-02 MED ORDER — INSULIN ASPART PROT & ASPART (70-30 MIX) 100 UNIT/ML PEN: 80.0000 [IU] | PEN_INJECTOR | Freq: Two times a day (BID) | SUBCUTANEOUS | Status: DC

## 2014-10-02 NOTE — Progress Notes (Signed)
Subjective:    Patient ID: Christopher Horne, male    DOB: 08-May-1971, 44 y.o.   MRN: [id number]  HPI Pt returns for f/u of diabetes mellitus: DM type: Insulin-requiring type 2. Dx'ed: 2012. Complications: retinopathy. Therapy: insulin since dx. DKA: never Severe hypoglycemia: once, in may of 2015.   Pancreatitis: never Other: pt has chosen a qd insulin regimen Interval history: no cbg record, but states cbg's vary from 80-200.  It is in general higher as the day goes on.  He says he never misses the insulin.   Past Medical History  Diagnosis Date  . Diabetes mellitus without complication   . Hyperlipidemia   . Thyroid disease     No past surgical history on file.  History   Social History  . Marital Status: Married    Spouse Name: N/A    Number of Children: N/A  . Years of Education: N/A   Occupational History  . Not on file.   Social History Main Topics  . Smoking status: Never Smoker   . Smokeless tobacco: Never Used  . Alcohol Use: Yes     Comment: once a month, beer  . Drug Use: No  . Sexual Activity: Not on file   Other Topics Concern  . Not on file   Social History Narrative    Current Outpatient Prescriptions on File Prior to Visit  Medication Sig Dispense Refill  . acetaminophen (TYLENOL) 325 MG tablet Take 650 mg by mouth every 6 (six) hours as needed for mild pain.    Marland Kitchen. enalapril (VASOTEC) 5 MG tablet Take 1 tablet (5 mg total) by mouth daily. 90 tablet 1  . ibuprofen (ADVIL,MOTRIN) 800 MG tablet Take 1 tablet (800 mg total) by mouth 3 (three) times daily. 21 tablet 0  . levothyroxine (SYNTHROID, LEVOTHROID) 25 MCG tablet Take 1 tablet (25 mcg total) by mouth daily before breakfast. 90 tablet 1  . lovastatin (MEVACOR) 20 MG tablet Take 1 tablet (20 mg total) by mouth at bedtime. 90 tablet 1   No current facility-administered medications on file prior to visit.    No Known Allergies  Family History  Problem Relation Age of Onset  . Diabetes  Maternal Grandmother   . Diabetes Paternal Grandmother     BP 132/82 mmHg  Pulse 89  Temp(Src) 98.1 F (36.7 C) (Oral)  Ht 5' 10.5" (1.791 m)  Wt 194 lb (87.998 kg)  BMI 27.43 kg/m2  SpO2 98%    Review of Systems He denies hypoglycemia.  He has gained a few lbs.     Objective:   Physical Exam VITAL SIGNS:  See vs page GENERAL: no distress Pulses: dorsalis pedis intact bilat.   Feet: no deformity. no edema.  Skin: no ulcer on the feet. normal color and temp.  Neuro: sensation is intact to touch on the feet     Lab Results  Component Value Date   HGBA1C 12.9* 10/02/2014       Assessment & Plan:  DM: severe exacerbation, worse.  The pattern of his cbg's indicates that if he stays with qd insulin, he needs a faster-acting type.     Noncompliance with cbg recording: I'll work around this as best I can   Patient is advised the following: Patient Instructions  check your blood sugar twice a day.  vary the time of day when you check, between before the 3 meals, and at bedtime.  also check if you have symptoms of your blood sugar being too  high or too low.  please keep a record of the readings and bring it to your next appointment here.  You can write it on any piece of paper.  please call us sooner if your blood sugar goes below 70, or if you have a lot of readings over 200.  blood tests are being requested for you today.  We'll let you know about the results. If it is high, we can change to another insulin that works more later in the day.   Please come back for a follow-up appointment in 2 months.

## 2014-10-02 NOTE — Patient Instructions (Addendum)
check your blood sugar twice a day.  vary the time of day when you check, between before the 3 meals, and at bedtime.  also check if you have symptoms of your blood sugar being too high or too low.  please keep a record of the readings and bring it to your next appointment here.  You can write it on any piece of paper.  please call us sooner if your blood sugar goes below 70, or if you have a lot of readings over 200.  blood tests are being requested for you today.  We'll let you know about the results. If it is high, we can change to another insulin that works more later in the day.   Please come back for a follow-up appointment in 2 months.

## 2014-10-05 ENCOUNTER — Other Ambulatory Visit: Payer: Self-pay

## 2014-10-05 MED ORDER — INSULIN PEN NEEDLE 31G X 5 MM MISC
Status: DC
Start: 2014-10-05 — End: 2022-12-12

## 2014-10-12 ENCOUNTER — Telehealth: Payer: Self-pay | Admitting: Endocrinology

## 2014-10-12 MED ORDER — INSULIN LISPRO PROT & LISPRO (75-25 MIX) 100 UNIT/ML KWIKPEN: PEN_INJECTOR | SUBCUTANEOUS | Status: DC

## 2014-10-12 NOTE — Telephone Encounter (Signed)
See below and please advise. Alternative is Humalog 75/25.   Thanks!

## 2014-10-12 NOTE — Telephone Encounter (Signed)
Patient states that the insulin is too expensive   Can Dr. Everardo AllEllison please change the insulin for cheaper amount?   Please advise   [facility] [address]

## 2014-10-12 NOTE — Telephone Encounter (Signed)
Pt's wife advised of note below and voiced understanding. Rx sent to pharmacy.

## 2014-10-12 NOTE — Telephone Encounter (Signed)
Ok.  Same dosage 

## 2014-11-23 ENCOUNTER — Emergency Department (HOSPITAL_COMMUNITY)
Admission: EM | Admit: 2014-11-23 | Discharge: 2014-11-23 | Disposition: A | Discharge status: Home / Self Care | Payer: BLUE CROSS/BLUE SHIELD | Attending: Emergency Medicine | Admitting: Emergency Medicine

## 2014-11-23 ENCOUNTER — Telehealth: Payer: Self-pay | Admitting: Endocrinology

## 2014-11-23 ENCOUNTER — Encounter (HOSPITAL_COMMUNITY): Payer: Self-pay | Admitting: Emergency Medicine

## 2014-11-23 DIAGNOSIS — E11649 Type 2 diabetes mellitus with hypoglycemia without coma: Secondary | ICD-10-CM | POA: Diagnosis not present

## 2014-11-23 DIAGNOSIS — E162 Hypoglycemia, unspecified: Secondary | ICD-10-CM

## 2014-11-23 DIAGNOSIS — Z79899 Other long term (current) drug therapy: Secondary | ICD-10-CM | POA: Insufficient documentation

## 2014-11-23 DIAGNOSIS — E079 Disorder of thyroid, unspecified: Secondary | ICD-10-CM | POA: Diagnosis not present

## 2014-11-23 DIAGNOSIS — R55 Syncope and collapse: Secondary | ICD-10-CM | POA: Diagnosis not present

## 2014-11-23 DIAGNOSIS — Z794 Long term (current) use of insulin: Secondary | ICD-10-CM | POA: Diagnosis not present

## 2014-11-23 DIAGNOSIS — R4020 Unspecified coma: Secondary | ICD-10-CM

## 2014-11-23 LAB — I-STAT CHEM 8, ED
BUN: 16 mg/dL (ref 6–23)
CHLORIDE: 102 mmol/L (ref 96–112)
CREATININE: 1.2 mg/dL (ref 0.50–1.35)
Calcium, Ion: 1.17 mmol/L (ref 1.12–1.23)
GLUCOSE: 145 mg/dL — AB (ref 70–99)
HEMATOCRIT: 41 % (ref 39.0–52.0)
Hemoglobin: 13.9 g/dL (ref 13.0–17.0)
POTASSIUM: 3.9 mmol/L (ref 3.5–5.1)
Sodium: 141 mmol/L (ref 135–145)
TCO2: 23 mmol/L (ref 0–100)

## 2014-11-23 LAB — CBC
HCT: 37.9 % — ABNORMAL LOW (ref 39.0–52.0)
Hemoglobin: 12.6 g/dL — ABNORMAL LOW (ref 13.0–17.0)
MCH: 27 pg (ref 26.0–34.0)
MCHC: 33.2 g/dL (ref 30.0–36.0)
MCV: 81.3 fL (ref 78.0–100.0)
Platelets: 303 10*3/uL (ref 150–400)
RBC: 4.66 MIL/uL (ref 4.22–5.81)
RDW: 14.2 % (ref 11.5–15.5)
WBC: 7.5 10*3/uL (ref 4.0–10.5)

## 2014-11-23 LAB — CBG MONITORING, ED
GLUCOSE-CAPILLARY: 162 mg/dL — AB (ref 70–99)
Glucose-Capillary: 134 mg/dL — ABNORMAL HIGH (ref 70–99)

## 2014-11-23 NOTE — ED Notes (Addendum)
Pt states that while he was at work his blood sugar dropped. Pt states that he ate fruit this morning. Pt states that he did take his insulin this morning. Pt had syncopal episode at work and EMS was called. Pt states that EMS checked his sugar it was 34.  Pt was given an IV and not sure if EMS gave him anything or not.  Pt states that he waited to come with family instead of with EMS.

## 2014-11-23 NOTE — ED Provider Notes (Signed)
CSN: [id number]     Arrival date & time 11/23/14  1127 History   First MD Initiated Contact with Patient 11/23/14 1310     Chief Complaint  Patient presents with  . blood sugar dropped    . Loss of Consciousness     (Consider location/radiation/quality/duration/timing/severity/associated sxs/prior Treatment) HPI  Christopher Horne is a 44 y.o. male brought in by EMS for syncopal episode with CBG of 34 on scene. Patient states that he took his normal dose of 80 units of insulin this morning and he did not eat a regular breakfast, he states he had a banana and a pH at approximately 4:30 AM, the loss of consciousness was 8 AM. Patient states that there was a prodrome he felt nauseous, lightheaded he felt like he was trying to get to the candy that he had in his pocket but there were people around him that distracted him, thinking he woke up and he was on the ambulance. There was no chest pain, shortness of breath. This is the second episode of hypoglycemia in a month. Patient states that he does not check his blood sugars twice a day as directed. Patient recently started seeing an endocrinologist 3 months ago in his regimen has recently changed. He does not take any by mouth medications for his diabetes. Patient denies fever, chills, nausea, vomiting, chest pain, shortness of breath, abdominal pain, numbness, weakness, headache, change in bowel or bladder habits.  Christopher Horne [place] Past Medical History  Diagnosis Date  . Diabetes mellitus without complication   . Hyperlipidemia   . Thyroid disease    Past Surgical History  Procedure Laterality Date  . Eye surgery     Family History  Problem Relation Age of Onset  . Diabetes Maternal Grandmother   . Diabetes Paternal Grandmother    History  Substance Use Topics  . Smoking status: Never Smoker   . Smokeless tobacco: Never Used  . Alcohol Use: Yes     Comment: once a month, beer    Review of Systems    Allergies  Review of  patient's allergies indicates no known allergies.  Home Medications   Prior to Admission medications   Medication Sig Start Date End Date Taking? Authorizing Provider  enalapril (VASOTEC) 5 MG tablet Take 1 tablet (5 mg total) by mouth daily. 06/11/14  Yes Toniann Ket, PA-C  ibuprofen (ADVIL,MOTRIN) 800 MG tablet Take 1 tablet (800 mg total) by mouth 3 (three) times daily. 01/20/14  Yes Shari Upstill, PA-C  insulin aspart protamine - aspart (NOVOLOG MIX 70/30 FLEXPEN) (70-30) 100 UNIT/ML FlexPen Inject 0.8 mLs (80 Units total) into the skin 2 (two) times daily. And pen needles 2/day 10/02/14  Yes Romero Belling, MD  Insulin Pen Needle (B-D UF III MINI PEN NEEDLES) 31G X 5 MM MISC Use to inject insulin 2/day. 10/05/14  Yes Romero Belling, MD  levothyroxine (SYNTHROID, LEVOTHROID) 25 MCG tablet Take 1 tablet (25 mcg total) by mouth daily before breakfast. 06/11/14  Yes Toniann Ket, PA-C  lovastatin (MEVACOR) 20 MG tablet Take 1 tablet (20 mg total) by mouth at bedtime. 06/11/14  Yes Toniann Ket, PA-C  Insulin Lispro Prot & Lispro (HUMALOG MIX 75/25 KWIKPEN) (75-25) 100 UNIT/ML Kwikpen Inject 80 units two times per day. Patient not taking: Reported on 11/23/2014 10/12/14   Romero Belling, MD   BP 133/83 mmHg  Pulse 82  Temp(Src) 98.2 F (36.8 C) (Oral)  Resp 19  Ht 6' (1.829 m)  Wt 192  lb (87.091 kg)  BMI 26.03 kg/m2  SpO2 99% Physical Exam  Constitutional: He is oriented to person, place, and time. He appears well-developed and well-nourished. No distress.  HENT:  Head: Normocephalic and atraumatic.  Mouth/Throat: Oropharynx is clear and moist.  Eyes: Conjunctivae and EOM are normal. Pupils are equal, round, and reactive to light.  Neck: Normal range of motion.  Cardiovascular: Normal rate, regular rhythm and intact distal pulses.   Pulmonary/Chest: Effort normal and breath sounds normal. No stridor. No respiratory distress. He has no wheezes. He has no rales. He exhibits no tenderness.   Abdominal: Soft. Bowel sounds are normal. He exhibits no distension and no mass. There is no tenderness. There is no rebound and no guarding.  Musculoskeletal: Normal range of motion. He exhibits no edema or tenderness.  Neurological: He is alert and oriented to person, place, and time.  Skin: Skin is warm.  Psychiatric: He has a normal mood and affect.  Nursing note and vitals reviewed.   ED Course  Procedures (including critical care time) Labs Review Labs Reviewed  CBC - Abnormal; Notable for the following:    Hemoglobin 12.6 (*)    HCT 37.9 (*)    All other components within normal limits  CBG MONITORING, ED - Abnormal; Notable for the following:    Glucose-Capillary 134 (*)    All other components within normal limits  I-STAT CHEM 8, ED - Abnormal; Notable for the following:    Glucose, Bld 145 (*)    All other components within normal limits    Imaging Review No results found.   EKG Interpretation None      MDM   Final diagnoses:  Hypoglycemia  Loss of consciousness    Filed Vitals:   11/23/14 1330 11/23/14 1400 11/23/14 1430 11/23/14 1443  BP: 133/83 136/84 141/89   Pulse: 82 87 82   Temp:    98 F (36.7 C)  TempSrc:    Oral  Resp: 19 15 16    Height:      Weight:      SpO2: 99% 100% 100%     Vira BlancoRichard Horne is a pleasant 44 y.o. male presenting with loss of consciousness with nausea and dizziness prodrome consistent with prior episodes of hypoglycemia. Patient was given his full dose of fast acting insulin this morning and he did not eat a full breakfast. States that he is been not consistent at checking his blood sugars. He recently started following with an endocrinologist and was transitioned to insulin from by mouth diabetic medications. On scene patient CBG was low at 34. Patient is alert and oriented 3, he denies any chest pain. His EKG with no arrhythmia or ischemic changes.  Encouraged him to follow him closely with his endocrinologist and  aggressively check his blood sugars and always eat a full meal for his ministers his insulin.  Discussed case with attending MD who agrees with plan and stability to d/c to home.   Evaluation does not show pathology that would require ongoing emergent intervention or inpatient treatment. Pt is hemodynamically stable and mentating appropriately. Discussed findings and plan with patient/guardian, who agrees with care plan. All questions answered. Return precautions discussed and outpatient follow up given.       Wynetta Emeryicole Lynsee Wands, PA-C 11/23/14 1642  Toy CookeyMegan Docherty, MD 11/26/14 [phone]

## 2014-11-23 NOTE — Progress Notes (Signed)
  CARE MANAGEMENT ED NOTE 11/23/2014  Patient:  Christopher Horne,Christopher Horne   Account Number:  [id number]  Date Initiated:  11/23/2014  Documentation initiated by:  Edd ArbourGIBBS,Ferrell Claiborne  Subjective/Objective Assessment:   44 yr old [place] pt low blood sugar, loss of consciousness     Subjective/Objective Assessment Detail:   pcp Toniann Ketmatthew k Tucker "left" per wife and she is [facility] a [facility]u [facility] on 12/11/14 [facility] also followed by sean ellison per epic & wife     Action/Plan:   Spoke with pt and wife about f/u pcp Provided wife with contact info and names of all [place] [address] [place] providers to assist her with making pt next f/u appt   Action/Plan Detail:   Anticipated DC Date:  11/23/2014     Status Recommendation to Physician:   Result of Recommendation:    Other ED Services  Consult Working Plan    DC Planning Services  Other  Outpatient Services - Pt will follow up  PCP issues    Choice offered to / List presented to:            Status of service:  Completed, signed off  ED Comments:   ED Comments Detail:

## 2014-11-23 NOTE — Telephone Encounter (Signed)
See note below to be advise. The Blackouts started yesterday.

## 2014-11-23 NOTE — Telephone Encounter (Signed)
Pt is having black out this has been happening started new medication [facility] is [facility] gone by ambulance

## 2014-11-23 NOTE — ED Notes (Signed)
Bed: [id number] Expected date:  Expected time:  Means of arrival:  Comments: Ems- knee pain

## 2014-11-23 NOTE — Discharge Instructions (Signed)
Please follow with your primary care doctor in the next 2 days for a check-up. They must obtain records for further management.   Do not hesitate to return to the Emergency Department for any new, worsening or concerning symptoms.    Blood Glucose Monitoring Monitoring your blood glucose (also know as blood sugar) helps you to manage your diabetes. It also helps you and your health care provider monitor your diabetes and determine how well your treatment plan is working. WHY SHOULD YOU MONITOR YOUR BLOOD GLUCOSE?  It can help you understand how food, exercise, and medicine affect your blood glucose.  It allows you to know what your blood glucose is at any given moment. You can quickly tell if you are having low blood glucose (hypoglycemia) or high blood glucose (hyperglycemia).  It can help you and your health care provider know how to adjust your medicines.  It can help you understand how to manage an illness or adjust medicine for exercise. WHEN SHOULD YOU TEST? Your health care provider will help you decide how often you should check your blood glucose. This may depend on the type of diabetes you have, your diabetes control, or the types of medicines you are taking. Be sure to write down all of your blood glucose readings so that this information can be reviewed with your health care provider. See below for examples of testing times that your health care provider may suggest. Type 1 Diabetes  Test 4 times a day if you are in good control, using an insulin pump, or perform multiple daily injections.  If your diabetes is not well controlled or if you are sick, you may need to monitor more often.  It is a good idea to also monitor:  Before and after exercise.  Between meals and 2 hours after a meal.  Occasionally between 2:00 a.m. and 3:00 a.m. Type 2 Diabetes  It can vary with each person, but generally, if you are on insulin, test 4 times a day.  If you take medicines by mouth  (orally), test 2 times a day.  If you are on a controlled diet, test once a day.  If your diabetes is not well controlled or if you are sick, you may need to monitor more often. HOW TO MONITOR YOUR BLOOD GLUCOSE Supplies Needed  Blood glucose meter.  Test strips for your meter. Each meter has its own strips. You must use the strips that go with your own meter.  A pricking needle (lancet).  A device that holds the lancet (lancing device).  A journal or log book to write down your results. Procedure  Wash your hands with soap and water. Alcohol is not preferred.  Prick the side of your finger (not the tip) with the lancet.  Gently milk the finger until a small drop of blood appears.  Follow the instructions that come with your meter for inserting the test strip, applying blood to the strip, and using your blood glucose meter. Other Areas to Get Blood for Testing Some meters allow you to use other areas of your body (other than your finger) to test your blood. These areas are called alternative sites. The most common alternative sites are:  The forearm.  The thigh.  The back area of the lower leg.  The palm of the hand. The blood flow in these areas is slower. Therefore, the blood glucose values you get may be delayed, and the numbers are different from what you would get from your  fingers. Do not use alternative sites if you think you are having hypoglycemia. Your reading will not be accurate. Always use a finger if you are having hypoglycemia. Also, if you cannot feel your lows (hypoglycemia unawareness), always use your fingers for your blood glucose checks. ADDITIONAL TIPS FOR GLUCOSE MONITORING  Do not reuse lancets.  Always carry your supplies with you.  All blood glucose meters have a 24-hour "hotline" number to call if you have questions or need help.  Adjust (calibrate) your blood glucose meter with a control solution after finishing a few boxes of strips. BLOOD  GLUCOSE RECORD KEEPING It is a good idea to keep a daily record or log of your blood glucose readings. Most glucose meters, if not all, keep your glucose records stored in the meter. Some meters come with the ability to download your records to your home computer. Keeping a record of your blood glucose readings is especially helpful if you are wanting to look for patterns. Make notes to go along with the blood glucose readings because you might forget what happened at that exact time. Keeping good records helps you and your health care provider to work together to achieve good diabetes management.  Document Released: 08/24/2003 Document Revised: 01/05/2014 Document Reviewed: 01/13/2013 [facility] Patient Information 2015 [place], [place]. This information is not intended to replace advice given to you by your health care provider. Make sure you discuss any questions you have with your health care provider.  Hypoglycemia Hypoglycemia occurs when the glucose in your blood is too low. Glucose is a type of sugar that is your body's main energy source. Hormones, such as insulin and glucagon, control the level of glucose in the blood. Insulin lowers blood glucose and glucagon increases blood glucose. Having too much insulin in your blood stream, or not eating enough food containing sugar, can result in hypoglycemia. Hypoglycemia can happen to people with or without diabetes. It can develop quickly and can be a medical emergency.  CAUSES   Missing or delaying meals.  Not eating enough carbohydrates at meals.  Taking too much diabetes medicine.  Not timing your oral diabetes medicine or insulin doses with meals, snacks, and exercise.  Nausea and vomiting.  Certain medicines.  Severe illnesses, such as hepatitis, kidney disorders, and certain eating disorders.  Increased activity or exercise without eating something extra or adjusting medicines.  Drinking too much alcohol.  A nerve disorder that  affects body functions like your heart rate, blood pressure, and digestion (autonomic neuropathy).  A condition where the stomach muscles do not function properly (gastroparesis). Therefore, medicines and food may not absorb properly.  Rarely, a tumor of the pancreas can produce too much insulin. SYMPTOMS   Hunger.  Sweating (diaphoresis).  Change in body temperature.  Shakiness.  Headache.  Anxiety.  Lightheadedness.  Irritability.  Difficulty concentrating.  Dry mouth.  Tingling or numbness in the hands or feet.  Restless sleep or sleep disturbances.  Altered speech and coordination.  Change in mental status.  Seizures or prolonged convulsions.  Combativeness.  Drowsiness (lethargic).  Weakness.  Increased heart rate or palpitations.  Confusion.  Pale, gray skin color.  Blurred or double vision.  Fainting. DIAGNOSIS  A physical exam and medical history will be performed. Your caregiver may make a diagnosis based on your symptoms. Blood tests and other lab tests may be performed to confirm a diagnosis. Once the diagnosis is made, your caregiver will see if your signs and symptoms go away once your blood  glucose is raised.  TREATMENT  Usually, you can easily treat your hypoglycemia when you notice symptoms.  Check your blood glucose. If it is less than 70 mg/dl, take one of the following:   3-4 glucose tablets.    cup juice.    cup regular soda.   1 cup skim milk.   -1 tube of glucose gel.   5-6 hard candies.   Avoid high-fat drinks or food that may delay a rise in blood glucose levels.  Do not take more than the recommended amount of sugary foods, drinks, gel, or tablets. Doing so will cause your blood glucose to go too high.   Wait 10-15 minutes and recheck your blood glucose. If it is still less than 70 mg/dl or below your target range, repeat treatment.   Eat a snack if it is more than 1 hour until your next meal.  There  may be a time when your blood glucose may go so low that you are unable to treat yourself at home when you start to notice symptoms. You may need someone to help you. You may even faint or be unable to swallow. If you cannot treat yourself, someone will need to bring you to the hospital.  HOME CARE INSTRUCTIONS  If you have diabetes, follow your diabetes management plan by:  Taking your medicines as directed.  Following your exercise plan.  Following your meal plan. Do not skip meals. Eat on time.  Testing your blood glucose regularly. Check your blood glucose before and after exercise. If you exercise longer or different than usual, be sure to check blood glucose more frequently.  Wearing your medical alert jewelry that says you have diabetes.  Identify the cause of your hypoglycemia. Then, develop ways to prevent the recurrence of hypoglycemia.  Do not take a hot bath or shower right after an insulin shot.  Always carry treatment with you. Glucose tablets are the easiest to carry.  If you are going to drink alcohol, drink it only with meals.  Tell friends or family members ways to keep you safe during a seizure. This may include removing hard or sharp objects from the area or turning you on your side.  Maintain a healthy weight. SEEK MEDICAL CARE IF:   You are having problems keeping your blood glucose in your target range.  You are having frequent episodes of hypoglycemia.  You feel you might be having side effects from your medicines.  You are not sure why your blood glucose is dropping so low.  You notice a change in vision or a new problem with your vision. SEEK IMMEDIATE MEDICAL CARE IF:   Confusion develops.  A change in mental status occurs.  The inability to swallow develops.  Fainting occurs. Document Released: 08/21/2005 Document Revised: 08/26/2013 Document Reviewed: 12/18/2011 [facility] Patient Information 2015 [place], [place]. This information is not  intended to replace advice given to you by your health care provider. Make sure you discuss any questions you have with your health care provider.

## 2014-11-23 NOTE — ED Notes (Signed)
Patient given water for fluid challenge.  

## 2014-11-23 NOTE — ED Notes (Signed)
Patient's wife stated that the patient ate 2 small candy bars in the lobby prior to being placed in a room.

## 2014-11-23 NOTE — ED Notes (Signed)
Pt. Was given sprite and tolerated it well.

## 2014-11-24 ENCOUNTER — Telehealth: Payer: Self-pay

## 2014-11-24 NOTE — Telephone Encounter (Signed)
Pt's wife called to schedule follow up for this pt. Pt was in the hospital yesterday due to his blood sugar being low and passing out. Wife wanted MD to be advised she is concerned about how low his blood sugar has been. Pt will bring blood sugar log with him tomorrow for MD to review. Due to low blood sugar pt has only been taking 50 units of Novolog. Pt coming in for a appointment tomorrow at 2pm.

## 2014-11-25 ENCOUNTER — Encounter: Payer: Self-pay | Admitting: Endocrinology

## 2014-11-25 ENCOUNTER — Ambulatory Visit (INDEPENDENT_AMBULATORY_CARE_PROVIDER_SITE_OTHER): Payer: BLUE CROSS/BLUE SHIELD | Admitting: Endocrinology

## 2014-11-25 VITALS — BP 112/70 | HR 97 | Temp 97.6°F | Ht 72.0 in | Wt 184.0 lb

## 2014-11-25 DIAGNOSIS — E10329 Type 1 diabetes mellitus with mild nonproliferative diabetic retinopathy without macular edema: Secondary | ICD-10-CM

## 2014-11-25 DIAGNOSIS — E103299 Type 1 diabetes mellitus with mild nonproliferative diabetic retinopathy without macular edema, unspecified eye: Secondary | ICD-10-CM

## 2014-11-25 MED ORDER — INSULIN ASPART PROT & ASPART (70-30 MIX) 100 UNIT/ML PEN: 90.0000 [IU] | PEN_INJECTOR | Freq: Every day | SUBCUTANEOUS | Status: DC

## 2014-11-25 NOTE — Patient Instructions (Addendum)
check your blood sugar twice a day.  vary the time of day when you check, between before the 3 meals, and at bedtime.  also check if you have symptoms of your blood sugar being too high or too low.  please keep a record of the readings and bring it to your next appointment here.  You can write it on any piece of paper.  please call us sooner if your blood sugar goes below 70, or if you have a lot of readings over 200.  Please change the insulin to 100 units daily with breakfast, and none in the evening. On this type of insulin schedule, you should eat meals on a regular schedule.  If a meal is missed or significantly delayed, your blood sugar could go low. Please come back for a follow-up appointment in 1 month.

## 2014-11-25 NOTE — Progress Notes (Signed)
Subjective:    Patient ID: Christopher Horne, male    DOB: 1971-08-23, 44 y.o.   MRN: [id number]  HPI Pt returns for f/u of diabetes mellitus: DM type: Insulin-requiring type 2. Dx'ed: 2012. Complications: retinopathy. Therapy: insulin since dx. DKA: never Severe hypoglycemia: once, in may of 2015.   Pancreatitis: never Other: pt has chosen a simple insulin regimen.   Interval history: no cbg record, but states cbg's vary from 50-200.  It is in general higher as the day goes on.  He says he never misses the insulin.  At night, he has had several episodes of hypoglycemia (severe on one occasion).   Past Medical History  Diagnosis Date  . Diabetes mellitus without complication   . Hyperlipidemia   . Thyroid disease     Past Surgical History  Procedure Laterality Date  . Eye surgery      History   Social History  . Marital Status: Married    Spouse Name: N/A  . Number of Children: N/A  . Years of Education: N/A   Occupational History  . Not on file.   Social History Main Topics  . Smoking status: Never Smoker   . Smokeless tobacco: Never Used  . Alcohol Use: Yes     Comment: once a month, beer  . Drug Use: No  . Sexual Activity: Not on file   Other Topics Concern  . Not on file   Social History Narrative    Current Outpatient Prescriptions on File Prior to Visit  Medication Sig Dispense Refill  . enalapril (VASOTEC) 5 MG tablet Take 1 tablet (5 mg total) by mouth daily. 90 tablet 1  . ibuprofen (ADVIL,MOTRIN) 800 MG tablet Take 1 tablet (800 mg total) by mouth 3 (three) times daily. 21 tablet 0  . Insulin Pen Needle (B-D UF III MINI PEN NEEDLES) 31G X 5 MM MISC Use to inject insulin 2/day. 100 each 2  . levothyroxine (SYNTHROID, LEVOTHROID) 25 MCG tablet Take 1 tablet (25 mcg total) by mouth daily before breakfast. 90 tablet 1  . lovastatin (MEVACOR) 20 MG tablet Take 1 tablet (20 mg total) by mouth at bedtime. 90 tablet 1   No current facility-administered  medications on file prior to visit.    No Known Allergies  Family History  Problem Relation Age of Onset  . Diabetes Maternal Grandmother   . Diabetes Paternal Grandmother     BP 112/70 mmHg  Pulse 97  Temp(Src) 97.6 F (36.4 C) (Oral)  Ht 6' (1.829 m)  Wt 184 lb (83.462 kg)  BMI 24.95 kg/m2  SpO2 97%    Review of Systems Denies weight change    Objective:   Physical Exam VITAL SIGNS:  See vs page GENERAL: no distress Pulses: dorsalis pedis intact bilat.   MSK: no deformity of the feet CV: no leg edema Skin:  no ulcer on the feet.  normal color and temp on the feet. Neuro: sensation is intact to touch on the feet Ext: There is bilateral onychomycosis of the toenails      Assessment & Plan:  DM: overcontrolled  Patient is advised the following: Patient Instructions  check your blood sugar twice a day.  vary the time of day when you check, between before the 3 meals, and at bedtime.  also check if you have symptoms of your blood sugar being too high or too low.  please keep a record of the readings and bring it to your next appointment here.  You  can write it on any piece of paper.  please call us sooner if your blood sugar goes below 70, or if you have a lot of readings over 200.  Please change the insulin to 100 units daily with breakfast, and none in the evening. On this type of insulin schedule, you should eat meals on a regular schedule.  If a meal is missed or significantly delayed, your blood sugar could go low. Please come back for a follow-up appointment in 1 month.

## 2014-12-07 ENCOUNTER — Ambulatory Visit: Payer: BLUE CROSS/BLUE SHIELD | Admitting: Endocrinology

## 2014-12-11 ENCOUNTER — Other Ambulatory Visit (INDEPENDENT_AMBULATORY_CARE_PROVIDER_SITE_OTHER): Payer: BLUE CROSS/BLUE SHIELD

## 2014-12-11 ENCOUNTER — Encounter: Payer: Self-pay | Admitting: Endocrinology

## 2014-12-11 ENCOUNTER — Encounter: Payer: Self-pay | Admitting: Family

## 2014-12-11 ENCOUNTER — Ambulatory Visit (INDEPENDENT_AMBULATORY_CARE_PROVIDER_SITE_OTHER): Payer: BLUE CROSS/BLUE SHIELD | Admitting: Endocrinology

## 2014-12-11 ENCOUNTER — Ambulatory Visit (INDEPENDENT_AMBULATORY_CARE_PROVIDER_SITE_OTHER): Payer: BLUE CROSS/BLUE SHIELD | Admitting: Family

## 2014-12-11 VITALS — BP 128/80 | HR 85 | Temp 98.1°F | Resp 18 | Wt 189.8 lb

## 2014-12-11 VITALS — BP 128/84 | HR 82 | Temp 98.2°F | Ht 72.0 in | Wt 191.0 lb

## 2014-12-11 DIAGNOSIS — E039 Hypothyroidism, unspecified: Secondary | ICD-10-CM

## 2014-12-11 DIAGNOSIS — E103299 Type 1 diabetes mellitus with mild nonproliferative diabetic retinopathy without macular edema, unspecified eye: Secondary | ICD-10-CM

## 2014-12-11 DIAGNOSIS — E10329 Type 1 diabetes mellitus with mild nonproliferative diabetic retinopathy without macular edema: Secondary | ICD-10-CM | POA: Diagnosis not present

## 2014-12-11 DIAGNOSIS — E785 Hyperlipidemia, unspecified: Secondary | ICD-10-CM | POA: Insufficient documentation

## 2014-12-11 LAB — TSH: TSH: 0.54 u[IU]/mL (ref 0.35–4.50)

## 2014-12-11 MED ORDER — INSULIN ASPART PROT & ASPART (70-30 MIX) 100 UNIT/ML PEN: 80.0000 [IU] | PEN_INJECTOR | Freq: Every day | SUBCUTANEOUS | Status: DC

## 2014-12-11 NOTE — Assessment & Plan Note (Signed)
Stable with current regimen. Previous TSH was 0.20. Obtain a TSH. Continue current dosage of levothyroxine pending lab results.

## 2014-12-11 NOTE — Assessment & Plan Note (Addendum)
Previous A1c of 12.9 indicating poorly controlled diabetes. Patient indicates his blood sugars at home have been ranging in the 70s to 100s in the morning. Continue current dosage of NovoLog 70/30. Adjustments per Dr. Everardo AllEllison of endocrinology. Refer to diabetic education per patient request.

## 2014-12-11 NOTE — Progress Notes (Signed)
Pre visit review using our clinic review tool, if applicable. No additional management support is needed unless otherwise documented below in the visit note. 

## 2014-12-11 NOTE — Progress Notes (Signed)
   Subjective:    Patient ID: Christopher Horne, male    DOB: February 24, 1971, 44 y.o.   MRN: [id number]  Chief Complaint  Patient presents with  . Establish Care    would like a referral to nutritionist     HPI:  Christopher Horne is a 44 y.o. male who presents today to establish care and discuss his diabetes and nutrition.     1) Diabetes - Type II diabetes was previously diagnosed andis currently being treated with insulin 70/30. Currently being managed by Dr. Everardo AllEllison. Patient is interested in being referred to a dietician for diabetes meal planning.   Lab Results  Component Value Date   HGBA1C 12.9* 10/02/2014   Lab Results  Component Value Date   CREATININE 1.20 11/23/2014    2) Hypothyroidism - stable with current regimen and treated with levothyroxine.  Lab Results  Component Value Date   TSH 0.20* 05/14/2014    3) Hyperlipidemia - Currently stable on Mev[place]or. Denies myalgias.  Lab Results  Component Value Date   CHOL 172 05/14/2014   HDL 34.80* 05/14/2014   LDLDIRECT 124.6 05/14/2014   TRIG 204.0* 05/14/2014   CHOLHDL 5 05/14/2014    No Known Allergies   Current Outpatient Prescriptions on File Prior to Visit  Medication Sig Dispense Refill  . enalapril (VASOTEC) 5 MG tablet Take 1 tablet (5 mg total) by mouth daily. 90 tablet 1  . ibuprofen (ADVIL,MOTRIN) 800 MG tablet Take 1 tablet (800 mg total) by mouth 3 (three) times daily. 21 tablet 0  . Insulin Pen Needle (B-D UF III MINI PEN NEEDLES) 31G X 5 MM MISC Use to inject insulin 2/day. 100 each 2  . levothyroxine (SYNTHROID, LEVOTHROID) 25 MCG tablet Take 1 tablet (25 mcg total) by mouth daily before breakfast. 90 tablet 1  . lovastatin (MEVACOR) 20 MG tablet Take 1 tablet (20 mg total) by mouth at bedtime. 90 tablet 1   No current facility-administered medications on file prior to visit.   Past Medical History  Diagnosis Date  . Diabetes mellitus without complication   . Hyperlipidemia   . Thyroid  disease      Review of Systems  Cardiovascular: Negative for chest pain, palpitations and leg swelling.  Gastrointestinal: Negative for diarrhea and constipation.  Endocrine: Negative for cold intolerance and heat intolerance.  Musculoskeletal: Negative for myalgias.  Neurological: Negative for headaches.      Objective:    BP 128/80 mmHg  Pulse 85  Temp(Src) 98.1 F (36.7 C) (Oral)  Resp 18  Wt 189 lb 12.8 oz (86.093 kg)  SpO2 98% Nursing note and vital signs reviewed.  Physical Exam  Constitutional: He is oriented to person, place, and time. He appears well-developed and well-nourished. No distress.  Cardiovascular: Normal rate, regular rhythm, normal heart sounds and intact distal pulses.   Pulmonary/Chest: Effort normal and breath sounds normal.  Neurological: He is alert and oriented to person, place, and time.  Skin: Skin is warm and dry.  Psychiatric: He has a normal mood and affect. His behavior is normal. Judgment and thought content normal.       Assessment & Plan:

## 2014-12-11 NOTE — Patient Instructions (Signed)
Thank you for choosing [facility].  Summary/Instructions:  Your prescription(s) have been submitted to your pharmacy or been printed and provided for you. Please take as directed and contact our office if you believe you are having problem(s) with the medication(s) or have any questions.  Please stop by the lab on the basement level of the building for your blood work. Your results will be released to MyChart (or called to you) after review, usually within 72 hours after test completion. If any changes need to be made, you will be notified at that same time.  If your symptoms worsen or fail to improve, please contact our office for further instruction, or in case of emergency go directly to the emergency room at the closest medical facility.     

## 2014-12-11 NOTE — Assessment & Plan Note (Signed)
Stable with current regimen. Denies any muscle pain. Continue lovastatin at current dosage. Recheck lipids during physical.

## 2014-12-11 NOTE — Patient Instructions (Addendum)
check your blood sugar twice a day.  vary the time of day when you check, between before the 3 meals, and at bedtime.  also check if you have symptoms of your blood sugar being too high or too low.  please keep a record of the readings and bring it to your next appointment here.  You can write it on any piece of paper.  please call us sooner if your blood sugar goes below 70, or if you have a lot of readings over 200.  Please reduce the insulin to 80 units daily with breakfast, and none in the evening. On this type of insulin schedule, you should eat meals on a regular schedule.  If a meal is missed or significantly delayed, your blood sugar could go low. Please come back for a follow-up appointment in 1 month.

## 2014-12-11 NOTE — Progress Notes (Signed)
Subjective:    Patient ID: Christopher Horne, male    DOB: February 22, 1971, 44 y.o.   MRN: [id number]  HPI Pt returns for f/u of diabetes mellitus: DM type: Insulin-requiring type 2. Dx'ed: 2012. Complications: retinopathy. Therapy: insulin since dx. DKA: never Severe hypoglycemia: twice: most recently in early 2016.   Pancreatitis: never Other: pt has chosen a simple insulin regimen; he works 4 AM-6PM.     Interval history: no cbg record, but states cbg's vary from 50-200's.  It is lowest before lunch, and highest later in the day.  He takes 90 units qam.  He says he never misses the insulin. Past Medical History  Diagnosis Date  . Diabetes mellitus without complication   . Hyperlipidemia   . Thyroid disease     Past Surgical History  Procedure Laterality Date  . Eye surgery      History   Social History  . Marital Status: Married    Spouse Name: N/A  . Number of Children: 0  . Years of Education: 14   Occupational History  . Fabricator    Social History Main Topics  . Smoking status: Never Smoker   . Smokeless tobacco: Never Used  . Alcohol Use: Yes     Comment: once a month, beer  . Drug Use: No  . Sexual Activity: Not on file   Other Topics Concern  . Not on file   Social History Narrative   Currently lives with his wife. Fun: Go to the beach   Denies religious beliefs effecting health care.     Current Outpatient Prescriptions on File Prior to Visit  Medication Sig Dispense Refill  . enalapril (VASOTEC) 5 MG tablet Take 1 tablet (5 mg total) by mouth daily. 90 tablet 1  . ibuprofen (ADVIL,MOTRIN) 800 MG tablet Take 1 tablet (800 mg total) by mouth 3 (three) times daily. 21 tablet 0  . Insulin Pen Needle (B-D UF III MINI PEN NEEDLES) 31G X 5 MM MISC Use to inject insulin 2/day. 100 each 2  . levothyroxine (SYNTHROID, LEVOTHROID) 25 MCG tablet Take 1 tablet (25 mcg total) by mouth daily before breakfast. 90 tablet 1  . lovastatin (MEVACOR) 20 MG tablet Take  1 tablet (20 mg total) by mouth at bedtime. 90 tablet 1   No current facility-administered medications on file prior to visit.    No Known Allergies  Family History  Problem Relation Age of Onset  . Diabetes Maternal Grandmother   . Diabetes Paternal Grandmother   . Healthy Mother     BP 128/84 mmHg  Pulse 82  Temp(Src) 98.2 F (36.8 C) (Oral)  Ht 6' (1.829 m)  Wt 191 lb (86.637 kg)  BMI 25.90 kg/m2  SpO2 98%  Review of Systems He denies LOC    Objective:   Physical Exam VITAL SIGNS:  See vs page GENERAL: no distress Pulses: dorsalis pedis intact bilat.   MSK: no deformity of the feet CV: no leg edema Skin:  no ulcer on the feet.  normal color and temp on the feet. Neuro: sensation is intact to touch on the feet Ext: There is bilateral onychomycosis of the toenails       Assessment & Plan:  DM: overcontrolled  Patient is advised the following: Patient Instructions  check your blood sugar twice a day.  vary the time of day when you check, between before the 3 meals, and at bedtime.  also check if you have symptoms of your blood sugar being too  high or too low.  please keep a record of the readings and bring it to your next appointment here.  You can write it on any piece of paper.  please call us sooner if your blood sugar goes below 70, or if you have a lot of readings over 200.  Please reduce the insulin to 80 units daily with breakfast, and none in the evening. On this type of insulin schedule, you should eat meals on a regular schedule.  If a meal is missed or significantly delayed, your blood sugar could go low. Please come back for a follow-up appointment in 1 month.

## 2014-12-13 ENCOUNTER — Telehealth: Payer: Self-pay | Admitting: Family

## 2014-12-13 DIAGNOSIS — E039 Hypothyroidism, unspecified: Secondary | ICD-10-CM

## 2014-12-13 NOTE — Telephone Encounter (Signed)
Please inform the patient that his TSH was 0.54 indicating that his thyroid is okay for now. I am still not 100% convinced that we need to continue the medication right now, but we will plan to recheck his TSH in 6 weeks and see where he is. The lab orders have been placed.

## 2014-12-14 NOTE — Telephone Encounter (Signed)
LVM for pt to call back.

## 2014-12-15 NOTE — Telephone Encounter (Signed)
Called pt and wife answered. Gave her lab results. She will relay to husband and call back if anymore questions.

## 2014-12-23 ENCOUNTER — Ambulatory Visit (INDEPENDENT_AMBULATORY_CARE_PROVIDER_SITE_OTHER): Payer: BLUE CROSS/BLUE SHIELD | Admitting: Endocrinology

## 2014-12-23 ENCOUNTER — Encounter: Payer: Self-pay | Admitting: Family

## 2014-12-23 ENCOUNTER — Encounter: Payer: Self-pay | Admitting: Endocrinology

## 2014-12-23 ENCOUNTER — Ambulatory Visit (INDEPENDENT_AMBULATORY_CARE_PROVIDER_SITE_OTHER): Payer: BLUE CROSS/BLUE SHIELD | Admitting: Family

## 2014-12-23 VITALS — BP 100/78 | HR 98 | Temp 97.4°F | Resp 18 | Ht 72.0 in | Wt 171.0 lb

## 2014-12-23 VITALS — BP 114/80 | HR 109 | Temp 97.9°F | Ht 72.0 in | Wt 171.0 lb

## 2014-12-23 DIAGNOSIS — E10329 Type 1 diabetes mellitus with mild nonproliferative diabetic retinopathy without macular edema: Secondary | ICD-10-CM | POA: Diagnosis not present

## 2014-12-23 DIAGNOSIS — E103299 Type 1 diabetes mellitus with mild nonproliferative diabetic retinopathy without macular edema, unspecified eye: Secondary | ICD-10-CM

## 2014-12-23 DIAGNOSIS — R5383 Other fatigue: Secondary | ICD-10-CM | POA: Diagnosis not present

## 2014-12-23 LAB — BASIC METABOLIC PANEL
BUN: 24 mg/dL — ABNORMAL HIGH (ref 6–23)
CHLORIDE: 96 meq/L (ref 96–112)
CO2: 29 meq/L (ref 19–32)
Calcium: 10.5 mg/dL (ref 8.4–10.5)
Creatinine, Ser: 1.76 mg/dL — ABNORMAL HIGH (ref 0.40–1.50)
GFR: 54.95 mL/min — AB (ref >60.00)
GLUCOSE: 117 mg/dL — AB (ref 70–99)
POTASSIUM: 3.6 meq/L (ref 3.5–5.1)
SODIUM: 135 meq/L (ref 135–145)

## 2014-12-23 LAB — POCT URINALYSIS DIPSTICK
BILIRUBIN UA: NEGATIVE
Leukocytes, UA: NEGATIVE
NITRITE UA: NEGATIVE
RBC UA: NEGATIVE
Urobilinogen, UA: NEGATIVE
pH, UA: 5.5

## 2014-12-23 MED ORDER — INSULIN ASPART PROT & ASPART (70-30 MIX) 100 UNIT/ML PEN: 100.0000 [IU] | PEN_INJECTOR | Freq: Every day | SUBCUTANEOUS | Status: DC

## 2014-12-23 NOTE — Assessment & Plan Note (Addendum)
In office POCT blood glucose was 184 which is down from 240 this morning when he was seen by Endocrinology. UA showed specific gravity of 1.030 and positive for trace ketones. This is most likely related to fluctuations in his blood sugar, and concern for potential early DKA considering blood sugar 500. Discussed options of wait and see with hydration at home or going to the ED. Patient elected to go home for now and hydrate with water. Instructed to continue to take his insulin as prescribed. Follow up if symptoms worsen of fail to improve. If altered mental status occurs to seek emergency care.

## 2014-12-23 NOTE — Progress Notes (Signed)
   Subjective:    Patient ID: Christopher Horne, male    DOB: 1971-02-07, 44 y.o.   MRN: [id number]  Chief Complaint  Patient presents with  . Diabetes    Complaining of pain in his legs and has been sweating, fatigue, still having high sugars, no appetite    HPI:  Christopher BlancoRichard Piatt is a 44 y.o. male who presents today for an acute office visit.   Associated symptoms of pain in his legs, sweating and fatigue have been going on for about the last 3 days. Initially he believed the fatigue was related to doing a lot of stuff and the feeling overall tired. He was noted to have a blood sugar of over 500 yesterday evening following dinner. This morning his blood sugar was noted to be 240 in the Endocrinology office. States that his blood sugars have been labile over the past couple of weeks. A BMET was drawn this morning following his office visit with Endocrinology.    No Known Allergies   Current Outpatient Prescriptions on File Prior to Visit  Medication Sig Dispense Refill  . enalapril (VASOTEC) 5 MG tablet Take 1 tablet (5 mg total) by mouth daily. 90 tablet 1  . ibuprofen (ADVIL,MOTRIN) 800 MG tablet Take 1 tablet (800 mg total) by mouth 3 (three) times daily. 21 tablet 0  . insulin aspart protamine - aspart (NOVOLOG MIX 70/30 FLEXPEN) (70-30) 100 UNIT/ML FlexPen Inject 1 mL (100 Units total) into the skin daily with breakfast. And pen needles 2/day 30 mL 11  . Insulin Pen Needle (B-D UF III MINI PEN NEEDLES) 31G X 5 MM MISC Use to inject insulin 2/day. 100 each 2  . levothyroxine (SYNTHROID, LEVOTHROID) 25 MCG tablet Take 1 tablet (25 mcg total) by mouth daily before breakfast. 90 tablet 1  . lovastatin (MEVACOR) 20 MG tablet Take 1 tablet (20 mg total) by mouth at bedtime. 90 tablet 1   No current facility-administered medications on file prior to visit.      Review of Systems  Constitutional: Positive for diaphoresis. Negative for fever and chills.  Gastrointestinal: Negative for  nausea and vomiting.  Endocrine: Negative for polydipsia, polyphagia and polyuria.  Neurological: Positive for headaches. Negative for dizziness, weakness and light-headedness.      Objective:    BP 100/78 mmHg  Pulse 98  Temp(Src) 97.4 F (36.3 C) (Oral)  Resp 18  Ht 6' (1.829 m)  Wt 171 lb (77.565 kg)  BMI 23.19 kg/m2  SpO2 99% Nursing note and vital signs reviewed.  Physical Exam  Constitutional: He is oriented to person, place, and time. He appears well-developed and well-nourished. No distress.  Appears sleepy and fatigued. Dressed appropriately for the situation, responds appropriately to questions   Cardiovascular: Normal rate, regular rhythm, normal heart sounds and intact distal pulses.   Pulmonary/Chest: Effort normal and breath sounds normal.  Neurological: He is alert and oriented to person, place, and time.  Skin: Skin is warm and dry.  Psychiatric: He has a normal mood and affect. His behavior is normal. Judgment and thought content normal.       Assessment & Plan:

## 2014-12-23 NOTE — Patient Instructions (Signed)
Diabetic Ketoacidosis °Diabetic ketoacidosis (DKA) is a life-threatening complication of type 1 diabetes. It must be quickly recognized and treated. Treatment requires hospitalization. °CAUSES  °When there is no insulin in the body, glucose (sugar) cannot be used, and the body breaks down fat for energy. When fat breaks down, acids (ketones) build up in the blood. Very high levels of glucose and high levels of acids lead to severe loss of body fluids (dehydration) and other dangerous chemical changes. This stresses your vital organs and can cause coma or death. °SIGNS AND SYMPTOMS  °· Tiredness (fatigue). °· Weight loss. °· Excessive thirst. °· Ketones in your urine. °· Light-headedness. °· Fruity or sweet smelling breath. °· Excessive urination. °· Visual changes. °· Confusion or irritability. °· Nausea or vomiting. °· Rapid breathing. °· Stomachache or abdominal pain. °DIAGNOSIS  °Your health care provider will diagnose DKA based on your history, physical exam, and blood tests. The health care provider will check to see if you have another illness that caused you to go into DKA. Most of this will be done quickly in an emergency room. °TREATMENT  °· Fluid replacement to correct dehydration. °· Insulin. °· Correction of electrolytes, such as potassium and sodium. °· Antibiotic medicines. °PREVENTION °· Always take your insulin. Do not skip your insulin injections. °· If you are sick, treat yourself quickly. Your body often needs more insulin to fight the illness. °· Check your blood glucose regularly. °· Check urine ketones if your blood glucose is greater than 240 milligrams per deciliter (mg/dL). °· Do not use outdated (expired) insulin. °· If your blood glucose is high, drink plenty of fluids. This helps flush out ketones. °HOME CARE INSTRUCTIONS  °· If you are sick, follow the advice of your health care provider. °· To prevent dehydration, drink enough water and fluids to keep your urine clear or pale  yellow. °¨ If you cannot eat, alternate between drinking fluids with sugar (soda, juices, flavored gelatin) and salty fluids (broth, bouillon). °¨ If you can eat, follow your usual diet and drink sugar-free liquids (water, diet drinks). °· Always take your usual dose of insulin. If you cannot eat or if your glucose is getting too low, call your health care provider for further instructions. °· Continue to monitor your blood or urine ketones every 3-4 hours around the clock. Set your alarm clock or have someone wake you up. If you are too sick, have someone test it for you. °· Rest and avoid exercise. °SEEK MEDICAL CARE IF:  °· You have a fever. °· You have ketones in your urine, or your blood glucose is higher than a level your health care provider suggests. You may need extra insulin. Call your health care provider if you need advice on adjusting your insulin. °· You cannot drink at least a tablespoon (15 mL) of fluid every 15-20 minutes. °· You have been vomiting for more than 2 hours. °· You have symptoms of DKA: °¨ Fruity smelling breath. °¨ Breathing faster or slower. °¨ Becoming very sleepy. °SEEK IMMEDIATE MEDICAL CARE IF:  °· You have signs of dehydration: °¨ Decreased urination. °¨ Increased thirst. °¨ Dry skin and mouth. °¨ Light-headedness. °· Your blood glucose is very high (as advised by your health care provider) twice in a row. °· You faint. °· You have chest pain or trouble breathing. °· You have a sudden, severe headache. °· You have sudden weakness in one arm or one leg. °· You have sudden trouble speaking or swallowing. °· You   have vomiting or diarrhea that is getting worse after 3 hours. °· You have abdominal pain. °MAKE SURE YOU:  °· Understand these instructions. °· Will watch your condition. °· Will get help right away if you are not doing well or get worse. °Document Released: 08/18/2000 Document Revised: 08/26/2013 Document Reviewed: 02/24/2009 °ExitCare® Patient Information ©2015 [employer]  [employer]. This information is not intended to replace advice given to you by your health care provider. Make sure you discuss any questions you have with your health care provider. ° °

## 2014-12-23 NOTE — Progress Notes (Signed)
Subjective:    Patient ID: Christopher Horne, male    DOB: April 07, 1971, 44 y.o.   MRN: [id number]  HPI Pt returns for f/u of diabetes mellitus:  DM type: Insulin-requiring type 2. Dx'ed: 2012. Complications: retinopathy. Therapy: insulin since dx. DKA: never.  Severe hypoglycemia: twice (most recently in early 2016).   Pancreatitis: never Other: pt has chosen a simple insulin regimen; he works 4 AM-6PM.     Interval history: he says he never misses the insulin.  He takes 80 units qam. no cbg record, but states cbg's are frequently over 600.  There is no trend throughout the day.  He has urinary frequency.  Yesterday, he had 1 episode of n/v.   Past Medical History  Diagnosis Date  . Diabetes mellitus without complication   . Hyperlipidemia   . Thyroid disease     Past Surgical History  Procedure Laterality Date  . Eye surgery      History   Social History  . Marital Status: Married    Spouse Name: N/A  . Number of Children: 0  . Years of Education: 14   Occupational History  . Fabricator    Social History Main Topics  . Smoking status: Never Smoker   . Smokeless tobacco: Never Used  . Alcohol Use: Yes     Comment: once a month, beer  . Drug Use: No  . Sexual Activity: Not on file   Other Topics Concern  . Not on file   Social History Narrative   Currently lives with his wife. Fun: Go to the beach   Denies religious beliefs effecting health care.     Current Outpatient Prescriptions on File Prior to Visit  Medication Sig Dispense Refill  . enalapril (VASOTEC) 5 MG tablet Take 1 tablet (5 mg total) by mouth daily. 90 tablet 1  . ibuprofen (ADVIL,MOTRIN) 800 MG tablet Take 1 tablet (800 mg total) by mouth 3 (three) times daily. 21 tablet 0  . Insulin Pen Needle (B-D UF III MINI PEN NEEDLES) 31G X 5 MM MISC Use to inject insulin 2/day. 100 each 2  . levothyroxine (SYNTHROID, LEVOTHROID) 25 MCG tablet Take 1 tablet (25 mcg total) by mouth daily before breakfast.  90 tablet 1  . lovastatin (MEVACOR) 20 MG tablet Take 1 tablet (20 mg total) by mouth at bedtime. 90 tablet 1   No current facility-administered medications on file prior to visit.    No Known Allergies  Family History  Problem Relation Age of Onset  . Diabetes Maternal Grandmother   . Diabetes Paternal Grandmother   . Healthy Mother     BP 114/80 mmHg  Pulse 109  Temp(Src) 97.9 F (36.6 C) (Oral)  Ht 6' (1.829 m)  Wt 171 lb (77.565 kg)  BMI 23.19 kg/m2  SpO2 98%   Review of Systems Denies sob and LOC.     Objective:   Physical Exam VITAL SIGNS:  See vs page GENERAL: no distress Pulses: dorsalis pedis intact bilat.   MSK: no deformity of the feet CV: no leg edema Skin:  no ulcer on the feet.  normal color and temp on the feet. Neuro: sensation is intact to touch on the feet.    Lab Results  Component Value Date   HGBA1C 12.9* 10/02/2014   Lab Results  Component Value Date   CREATININE 1.76* 12/23/2014   BUN 24* 12/23/2014   NA 135 12/23/2014   K 3.6 12/23/2014   CL 96 12/23/2014   CO2  29 12/23/2014      Assessment & Plan:  DM: worse despite increased insulin dosage. Renal insufficiency: stable.  We'll follow. Noncompliance with cbg recording and insulin dosing: we discussed. Episode of n/v, uncertain etiology.  No evidence of DKA on BMET.  Patient is advised the following: Patient Instructions  check your blood sugar twice a day.  vary the time of day when you check, between before the 3 meals, and at bedtime.  also check if you have symptoms of your blood sugar being too high or too low.  please keep a record of the readings and bring it to your next appointment here.  You can write it on any piece of paper.  please call us sooner if your blood sugar goes below 70, or if you have a lot of readings over 200.  Please increase the insulin to 100 units daily with breakfast, and none in the evening.  blood tests are requested for you today.  We'll let you  know about the results. Drink plenty of fluids.  On this type of insulin schedule, you should eat meals on a regular schedule.  If a meal is missed or significantly delayed, your blood sugar could go low. Please come back for a follow-up appointment in 2 weeks.

## 2014-12-23 NOTE — Patient Instructions (Addendum)
check your blood sugar twice a day.  vary the time of day when you check, between before the 3 meals, and at bedtime.  also check if you have symptoms of your blood sugar being too high or too low.  please keep a record of the readings and bring it to your next appointment here.  You can write it on any piece of paper.  please call us sooner if your blood sugar goes below 70, or if you have a lot of readings over 200.  Please increase the insulin to 100 units daily with breakfast, and none in the evening.  blood tests are requested for you today.  We'll let you know about the results. Drink plenty of fluids.  On this type of insulin schedule, you should eat meals on a regular schedule.  If a meal is missed or significantly delayed, your blood sugar could go low. Please come back for a follow-up appointment in 2 weeks.

## 2014-12-23 NOTE — Progress Notes (Signed)
Pre visit review using our clinic review tool, if applicable. No additional management support is needed unless otherwise documented below in the visit note. 

## 2014-12-24 LAB — GLUCOSE, POCT (MANUAL RESULT ENTRY): POC GLUCOSE: 183 mg/dL — AB (ref 70–99)

## 2014-12-24 NOTE — Addendum Note (Signed)
Addended by: Mercer PodWRENN, Kaimana Lurz E on: 12/24/2014 10:59 AM   Modules accepted: Orders

## 2014-12-25 ENCOUNTER — Telehealth: Payer: Self-pay | Admitting: Family

## 2014-12-25 NOTE — Telephone Encounter (Signed)
Spoke with pts wife and let her know the message below. Will have scheduling call and make an appointment to follow up next week.

## 2014-12-25 NOTE — Telephone Encounter (Signed)
After reviewing the labs for his kidneys, his creatinine and BUN are elevated, however this may due to the dehydration that he was experiencing. Please see how he is feeling and we can have him do a follow up kidney check sometime next week. I would not be concerned about this until after a recheck to confirm numbers.

## 2014-12-25 NOTE — Telephone Encounter (Signed)
Lab results have been received, but patients wife has some questions surrounding the results. Please give her a call to discuss.

## 2014-12-28 ENCOUNTER — Ambulatory Visit: Payer: BLUE CROSS/BLUE SHIELD | Admitting: Endocrinology

## 2015-01-01 ENCOUNTER — Ambulatory Visit (INDEPENDENT_AMBULATORY_CARE_PROVIDER_SITE_OTHER): Payer: BLUE CROSS/BLUE SHIELD | Admitting: Family

## 2015-01-01 ENCOUNTER — Encounter: Payer: Self-pay | Admitting: Family

## 2015-01-01 ENCOUNTER — Other Ambulatory Visit (INDEPENDENT_AMBULATORY_CARE_PROVIDER_SITE_OTHER): Payer: BLUE CROSS/BLUE SHIELD

## 2015-01-01 VITALS — BP 124/88 | HR 92 | Temp 98.0°F | Resp 18 | Ht 72.0 in | Wt 196.0 lb

## 2015-01-01 DIAGNOSIS — E039 Hypothyroidism, unspecified: Secondary | ICD-10-CM

## 2015-01-01 DIAGNOSIS — R5383 Other fatigue: Secondary | ICD-10-CM | POA: Diagnosis not present

## 2015-01-01 LAB — BASIC METABOLIC PANEL
BUN: 11 mg/dL (ref 6–23)
CALCIUM: 9.2 mg/dL (ref 8.4–10.5)
CO2: 30 mEq/L (ref 19–32)
Chloride: 104 mEq/L (ref 96–112)
Creatinine, Ser: 1.31 mg/dL (ref 0.40–1.50)
GFR: 77.25 mL/min (ref >60.00)
Glucose, Bld: 103 mg/dL — ABNORMAL HIGH (ref 70–99)
POTASSIUM: 4.5 meq/L (ref 3.5–5.1)
SODIUM: 138 meq/L (ref 135–145)

## 2015-01-01 NOTE — Patient Instructions (Addendum)
Thank you for choosing [facility].  Summary/Instructions:  Please stop by the lab on the basement level of the building for your blood work. Your results will be released to MyChart (or called to you) after review, usually within 72 hours after test completion. If any changes need to be made, you will be notified at that same time.  If your symptoms worsen or fail to improve, please contact our office for further instruction, or in case of emergency go directly to the emergency room at the closest medical facility.     

## 2015-01-01 NOTE — Assessment & Plan Note (Addendum)
Fatigue resolved with hydration and supportive care. Obtain basic metabolic panel to recheck kidney function. No further treatment needed at this time. Follow-up if symptoms return.

## 2015-01-01 NOTE — Progress Notes (Signed)
   Subjective:    Patient ID: Christopher Horne, male    DOB: September 05, 1970, 44 y.o.   MRN: [id number]  Chief Complaint  Patient presents with  . Follow-up    says he feels alot better than he did last time he was here    HPI:  Christopher BlancoRichard Spike is a 44 y.o. male with a PMH of hypothyroidism, fatigue, dyslipidemia and type 1 diabetes who presents today for follow up.  Patient was recently seen in the office for fatigue and elevated blood sugars. He was noted to have a blood sugar in the office of 184 and a urinalysis showed a specific gravity 1.030 with a trace of ketones. It is believed his symptoms resulting from his diabetes and fatigue. He was sent home with instructions to seek further care if his blood sugars elevated again and hydrate well.  He returns to the office today with her symptoms being resolved with hydration. Since that time his blood sugars have remained fairly stable and he has gained weight since he is able to eat again. He was also noted to have increased BUN/creatinine.  No Known Allergies  Current Outpatient Prescriptions on File Prior to Visit  Medication Sig Dispense Refill  . enalapril (VASOTEC) 5 MG tablet Take 1 tablet (5 mg total) by mouth daily. 90 tablet 1  . ibuprofen (ADVIL,MOTRIN) 800 MG tablet Take 1 tablet (800 mg total) by mouth 3 (three) times daily. 21 tablet 0  . insulin aspart protamine - aspart (NOVOLOG MIX 70/30 FLEXPEN) (70-30) 100 UNIT/ML FlexPen Inject 1 mL (100 Units total) into the skin daily with breakfast. And pen needles 2/day 30 mL 11  . Insulin Pen Needle (B-D UF III MINI PEN NEEDLES) 31G X 5 MM MISC Use to inject insulin 2/day. 100 each 2  . levothyroxine (SYNTHROID, LEVOTHROID) 25 MCG tablet Take 1 tablet (25 mcg total) by mouth daily before breakfast. 90 tablet 1  . lovastatin (MEVACOR) 20 MG tablet Take 1 tablet (20 mg total) by mouth at bedtime. 90 tablet 1   No current facility-administered medications on file prior to visit.     Review of Systems  Constitutional: Negative for fever, chills, diaphoresis and fatigue.  Endocrine: Negative for polydipsia, polyphagia and polyuria.  Neurological: Negative for headaches.      Objective:    BP 124/88 mmHg  Pulse 92  Temp(Src) 98 F (36.7 C) (Oral)  Resp 18  Ht 6' (1.829 m)  Wt 196 lb (88.905 kg)  BMI 26.58 kg/m2  SpO2 97% Nursing note and vital signs reviewed.  Physical Exam  Constitutional: He is oriented to person, place, and time. He appears well-developed and well-nourished. No distress.  Cardiovascular: Normal rate, regular rhythm, normal heart sounds and intact distal pulses.   Pulmonary/Chest: Effort normal and breath sounds normal.  Neurological: He is alert and oriented to person, place, and time.  Skin: Skin is warm and dry.  Psychiatric: He has a normal mood and affect. His behavior is normal. Judgment and thought content normal.       Assessment & Plan:

## 2015-01-01 NOTE — Assessment & Plan Note (Signed)
Stable with current regimen of levothyroxine. Obtain TSH. Continue current dosage of levothyroxine pending TSH results.

## 2015-01-02 ENCOUNTER — Telehealth: Payer: Self-pay | Admitting: Family

## 2015-01-02 LAB — TSH: TSH: 0.61 u[IU]/mL (ref 0.35–4.50)

## 2015-01-02 NOTE — Telephone Encounter (Signed)
Please inform the patient that his kidney function has improved indicating that it was indeed dehydration resulting in the elevated kidney dysfunction.

## 2015-01-02 NOTE — Telephone Encounter (Signed)
Also please inform him that his TSH is 0.61 indicating that no changes are needed at this time.

## 2015-01-04 NOTE — Telephone Encounter (Signed)
Christopher Horne called you back

## 2015-01-04 NOTE — Telephone Encounter (Signed)
LVM

## 2015-01-05 NOTE — Telephone Encounter (Signed)
The thyroid medication is most likely what is keeping his thyroid normal. If he would like to trial 6 weeks without it that is fine and we can recheck his TSH.

## 2015-01-05 NOTE — Telephone Encounter (Signed)
Due to the TSH being ok is it ok for him to come off of his thyroid medication at this time?

## 2015-01-06 NOTE — Telephone Encounter (Signed)
Left detailed message letting pt know. 

## 2015-01-07 ENCOUNTER — Telehealth: Payer: Self-pay | Admitting: Endocrinology

## 2015-01-07 NOTE — Telephone Encounter (Signed)
Patients wife called very concerned about her husbands insulin  She states that his insurance has changed and they did not get the cards Currently he is out of insulin and they cannot afford     Please advise patient   Thank you

## 2015-01-07 NOTE — Telephone Encounter (Signed)
I contacted the patients wife. She stated the patient has had a insurance change and they do not have proof of his insurance with [employer] yet. I advised pt's wife she needed to contact the pt's new insurance with [employer] to get the information that is needed for the pharmacy. She stated the pt did have some insulin left. I advised her to call if they were not able to pick up his insulin.

## 2015-01-11 ENCOUNTER — Ambulatory Visit: Payer: BLUE CROSS/BLUE SHIELD | Admitting: Endocrinology

## 2015-01-11 ENCOUNTER — Ambulatory Visit (INDEPENDENT_AMBULATORY_CARE_PROVIDER_SITE_OTHER): Payer: BLUE CROSS/BLUE SHIELD | Admitting: Endocrinology

## 2015-01-11 ENCOUNTER — Encounter: Payer: Self-pay | Admitting: Endocrinology

## 2015-01-11 VITALS — BP 132/86 | HR 87 | Temp 97.9°F | Wt 186.0 lb

## 2015-01-11 DIAGNOSIS — E103299 Type 1 diabetes mellitus with mild nonproliferative diabetic retinopathy without macular edema, unspecified eye: Secondary | ICD-10-CM

## 2015-01-11 DIAGNOSIS — E10329 Type 1 diabetes mellitus with mild nonproliferative diabetic retinopathy without macular edema: Secondary | ICD-10-CM | POA: Diagnosis not present

## 2015-01-11 NOTE — Progress Notes (Signed)
Subjective:    Patient ID: Christopher Horne, male    DOB: 1971-02-14, 44 y.o.   MRN: [id number]  HPI Pt returns for f/u of diabetes mellitus:  DM type: Insulin-requiring type 2. Dx'ed: 2012. Complications: retinopathy. Therapy: insulin since dx.  DKA: never.  Severe hypoglycemia: twice (most recently in early 2016).   Pancreatitis: never Other: pt has chosen a simple insulin regimen; he works 4 AM-6PM.     Interval history: he says he never misses the insulin.  no cbg record, but states cbg's are well-controlled.  Pt states 1 day of pain at the left index finger, and assoc pain.  This started from a chemical leaking through a glove at work. Past Medical History  Diagnosis Date  . Diabetes mellitus without complication   . Hyperlipidemia   . Thyroid disease     Past Surgical History  Procedure Laterality Date  . Eye surgery      History   Social History  . Marital Status: Married    Spouse Name: N/A  . Number of Children: 0  . Years of Education: 14   Occupational History  . Fabricator    Social History Main Topics  . Smoking status: Never Smoker   . Smokeless tobacco: Never Used  . Alcohol Use: Yes     Comment: once a month, beer  . Drug Use: No  . Sexual Activity: Not on file   Other Topics Concern  . Not on file   Social History Narrative   Currently lives with his wife. Fun: Go to the beach   Denies religious beliefs effecting health care.     Current Outpatient Prescriptions on File Prior to Visit  Medication Sig Dispense Refill  . enalapril (VASOTEC) 5 MG tablet Take 1 tablet (5 mg total) by mouth daily. 90 tablet 1  . ibuprofen (ADVIL,MOTRIN) 800 MG tablet Take 1 tablet (800 mg total) by mouth 3 (three) times daily. 21 tablet 0  . insulin aspart protamine - aspart (NOVOLOG MIX 70/30 FLEXPEN) (70-30) 100 UNIT/ML FlexPen Inject 1 mL (100 Units total) into the skin daily with breakfast. And pen needles 2/day 30 mL 11  . Insulin Pen Needle (B-D UF III  MINI PEN NEEDLES) 31G X 5 MM MISC Use to inject insulin 2/day. 100 each 2  . levothyroxine (SYNTHROID, LEVOTHROID) 25 MCG tablet Take 1 tablet (25 mcg total) by mouth daily before breakfast. 90 tablet 1  . lovastatin (MEVACOR) 20 MG tablet Take 1 tablet (20 mg total) by mouth at bedtime. 90 tablet 1   No current facility-administered medications on file prior to visit.    No Known Allergies  Family History  Problem Relation Age of Onset  . Diabetes Maternal Grandmother   . Diabetes Paternal Grandmother   . Healthy Mother     BP 132/86 mmHg  Pulse 87  Temp(Src) 97.9 F (36.6 C) (Oral)  Wt 186 lb (84.369 kg)  SpO2 97%  Review of Systems He denies hypoglycemia and fever.      Objective:   Physical Exam VITAL SIGNS:  See vs page GENERAL: no distress Left index finger pad: 5 mm shallow ulcer.   Pulses: dorsalis pedis intact bilat.   MSK: no deformity of the feet CV: no leg edema Skin:  no ulcer on the feet.  normal color and temp on the feet. Neuro: sensation is intact to touch on the feet.        Assessment & Plan:  DM: he needs increased rx  Skin ulcer, new.  Patient is advised the following: Patient Instructions  check your blood sugar twice a day.  vary the time of day when you check, between before the 3 meals, and at bedtime.  also check if you have symptoms of your blood sugar being too high or too low.  please keep a record of the readings and bring it to your next appointment here.  You can write it on any piece of paper.  please call us sooner if your blood sugar goes below 70, or if you have a lot of readings over 200.  Please increase the insulin to 100 units daily with breakfast, and none in the evening.  blood tests are requested for you today.  We'll let you know about the results. On this type of insulin schedule, you should eat meals on a regular schedule.  If a meal is missed or significantly delayed, your blood sugar could go low. Please come back for a  follow-up appointment in 2 months.   Keep the area on your fingertip covered with antibiotic ointment and a bandaid until it heals.  Please call Mr Carver FilaCalone if it does not get better soon.

## 2015-01-11 NOTE — Patient Instructions (Addendum)
check your blood sugar twice a day.  vary the time of day when you check, between before the 3 meals, and at bedtime.  also check if you have symptoms of your blood sugar being too high or too low.  please keep a record of the readings and bring it to your next appointment here.  You can write it on any piece of paper.  please call us sooner if your blood sugar goes below 70, or if you have a lot of readings over 200.  Please increase the insulin to 100 units daily with breakfast, and none in the evening.  blood tests are requested for you today.  We'll let you know about the results. On this type of insulin schedule, you should eat meals on a regular schedule.  If a meal is missed or significantly delayed, your blood sugar could go low. Please come back for a follow-up appointment in 2 months.   Keep the area on your fingertip covered with antibiotic ointment and a bandaid until it heals.  Please call Christopher Horne if it does not get better soon.

## 2015-01-25 ENCOUNTER — Telehealth: Payer: Self-pay | Admitting: Endocrinology

## 2015-01-25 MED ORDER — INSULIN LISPRO PROT & LISPRO (75-25 MIX) 100 UNIT/ML KWIKPEN: 100.0000 [IU] | PEN_INJECTOR | Freq: Every day | SUBCUTANEOUS | Status: DC

## 2015-01-25 NOTE — Telephone Encounter (Signed)
Pt called in said insurance will nol onger pay for novlogo 70/30, insurance wants a cheaper drug and it to be done thru mail order please call wife back at [phone](Tiffany Deretha EmoryChambers) Pt currently has medication it was filled on 01/07/15 , he has one box of pens left.  [facility] of 01/03/15

## 2015-01-25 NOTE — Telephone Encounter (Signed)
Ok, i have sent a prescription to your pharmacy  

## 2015-01-25 NOTE — Telephone Encounter (Signed)
See below. I placed [employer]'s formulary on your desk to review.  Thanks!

## 2015-01-25 NOTE — Telephone Encounter (Signed)
I contacted the patients wife and advised Rx for Humalog 75/25 has been sent to the patients pharmacy. Patients wife voiced understanding.

## 2015-01-27 ENCOUNTER — Other Ambulatory Visit: Payer: Self-pay

## 2015-01-27 DIAGNOSIS — I10 Essential (primary) hypertension: Secondary | ICD-10-CM

## 2015-01-27 DIAGNOSIS — E039 Hypothyroidism, unspecified: Secondary | ICD-10-CM

## 2015-01-27 MED ORDER — ENALAPRIL MALEATE 5 MG PO TABS: 5.0000 mg | ORAL_TABLET | Freq: Every day | ORAL | Status: DC

## 2015-01-27 MED ORDER — LEVOTHYROXINE SODIUM 25 MCG PO TABS: 25.0000 ug | ORAL_TABLET | Freq: Every day | ORAL | Status: DC

## 2015-02-26 ENCOUNTER — Telehealth: Payer: Self-pay | Admitting: Endocrinology

## 2015-02-26 ENCOUNTER — Other Ambulatory Visit: Payer: Self-pay | Admitting: *Deleted

## 2015-02-26 MED ORDER — ONETOUCH DELICA LANCETS FINE MISC: Status: AC

## 2015-02-26 MED ORDER — GLUCOSE BLOOD VI STRP: ORAL_STRIP | Status: DC

## 2015-02-26 NOTE — Telephone Encounter (Signed)
Please call pt regarding meter and samples

## 2015-02-26 NOTE — Telephone Encounter (Signed)
rx sent in for strips and lancets

## 2015-03-15 ENCOUNTER — Ambulatory Visit: Payer: BLUE CROSS/BLUE SHIELD | Admitting: Endocrinology

## 2015-05-06 ENCOUNTER — Ambulatory Visit (INDEPENDENT_AMBULATORY_CARE_PROVIDER_SITE_OTHER): Payer: 59 | Admitting: Family

## 2015-05-06 ENCOUNTER — Encounter: Payer: Self-pay | Admitting: Family

## 2015-05-06 VITALS — BP 112/78 | HR 87 | Temp 98.6°F | Resp 18 | Ht 72.0 in | Wt 194.0 lb

## 2015-05-06 DIAGNOSIS — IMO0001 Reserved for inherently not codable concepts without codable children: Secondary | ICD-10-CM

## 2015-05-06 DIAGNOSIS — Z794 Long term (current) use of insulin: Secondary | ICD-10-CM

## 2015-05-06 DIAGNOSIS — Z0289 Encounter for other administrative examinations: Secondary | ICD-10-CM

## 2015-05-06 DIAGNOSIS — E119 Type 2 diabetes mellitus without complications: Secondary | ICD-10-CM

## 2015-05-06 NOTE — Progress Notes (Signed)
Pre visit review using our clinic review tool, if applicable. No additional management support is needed unless otherwise documented below in the visit note. 

## 2015-05-06 NOTE — Progress Notes (Signed)
Subjective:    Patient ID: Christopher Horne, male    DOB: 11-Dec-1970, 44 y.o.   MRN: [id number]  Chief Complaint  Patient presents with  . Follow-up    Need forms filled out for [facility]    HPI:  Christopher Horne is a 44 y.o. male with a PMH of hypothyroidism, dyslipidemia, and insulin dependent diabetes who presents today for an office visit.  1.) Type 1 diabetes - Currently maintained on insulin 75/25. Takes the medication as prescribed and denies adverse effects or hypoglycemic events. Denies changes in vision or numbness or tingling. Was hospitalized briefly from 7/2-7/5 for a hypoglycemic event which he notes that he was not taking the medication as prescribed which resulted in a hypoglycemic event. Since that time he has had no lapses in his medications and his A1c is improving now at 9.1. . He is concurrently managed by Dr.  Theo Dills of [facility]. He presents today for completion of paperwork for the [facility].  No Known Allergies   Current Outpatient Prescriptions on File Prior to Visit  Medication Sig Dispense Refill  . enalapril (VASOTEC) 5 MG tablet Take 1 tablet (5 mg total) by mouth daily. 90 tablet 1  . glucose blood (ONETOUCH VERIO) test strip Use as instructed to check blood sugar 3 times per day dx code 100 each 3  . ibuprofen (ADVIL,MOTRIN) 800 MG tablet Take 1 tablet (800 mg total) by mouth 3 (three) times daily. 21 tablet 0  . Insulin Lispro Prot & Lispro (HUMALOG MIX 75/25 KWIKPEN) (75-25) 100 UNIT/ML Kwikpen Inject 100 Units into the skin daily with breakfast. And pen needles 2/day 45 mL 11  . Insulin Pen Needle (B-D UF III MINI PEN NEEDLES) 31G X 5 MM MISC Use to inject insulin 2/day. 100 each 2  . levothyroxine (SYNTHROID, LEVOTHROID) 25 MCG tablet Take 1 tablet (25 mcg total) by mouth daily before breakfast. 90 tablet 1  . lovastatin (MEVACOR) 20 MG tablet Take 1 tablet (20 mg total) by mouth at bedtime. 90 tablet 1  . ONETOUCH DELICA LANCETS FINE MISC  Use to check blood sugar 3 times per day dx code E11.9 100 each 3   No current facility-administered medications on file prior to visit.    Review of Systems  Constitutional: Negative for diaphoresis.  Eyes:       Negative for changes in vision.   Respiratory: Negative for chest tightness and shortness of breath.   Cardiovascular: Negative for chest pain, palpitations and leg swelling.  Gastrointestinal: Negative for nausea and vomiting.  Endocrine: Negative for polydipsia, polyphagia and polyuria.  Neurological: Negative for weakness, light-headedness, numbness and headaches.      Objective:    BP 112/78 mmHg  Pulse 87  Temp(Src) 98.6 F (37 C) (Oral)  Resp 18  Ht 6' (1.829 m)  Wt 194 lb (87.998 kg)  BMI 26.31 kg/m2  SpO2 96% Nursing note and vital signs reviewed.  Physical Exam  Constitutional: He is oriented to person, place, and time. He appears well-developed and well-nourished. No distress.  Cardiovascular: Normal rate, regular rhythm, normal heart sounds and intact distal pulses.   Pulmonary/Chest: Effort normal and breath sounds normal.  Neurological: He is alert and oriented to person, place, and time.  Skin: Skin is warm and dry.  Psychiatric: He has a normal mood and affect. His behavior is normal. Judgment and thought content normal.       Assessment & Plan:   Problem List Items Addressed This Visit  Endocrine   Insulin dependent diabetes mellitus - Primary    Stable with current regimen of insulin 75/25. Takes medication as prescribed and denies adverse side effects. Denies any additional hypoglycemic events since July 2016. Continue current dosage of insulin 75/25 at 30 units in the morning and 30 units in the evening. Changes per endocrinology. DMV paperwork completed per patient request.       Other Visit Diagnoses    Encounter for completion of form with patient

## 2015-05-06 NOTE — Assessment & Plan Note (Signed)
Stable with current regimen of insulin 75/25. Takes medication as prescribed and denies adverse side effects. Denies any additional hypoglycemic events since July 2016. Continue current dosage of insulin 75/25 at 30 units in the morning and 30 units in the evening. Changes per endocrinology. DMV paperwork completed per patient request.

## 2015-05-06 NOTE — Patient Instructions (Signed)
Thank you for choosing [facility].  Summary/Instructions:  Please continue to take you medications as prescribed.

## 2015-05-21 ENCOUNTER — Ambulatory Visit (INDEPENDENT_AMBULATORY_CARE_PROVIDER_SITE_OTHER): Payer: 59 | Admitting: Family

## 2015-05-21 ENCOUNTER — Encounter: Payer: Self-pay | Admitting: Family

## 2015-05-21 VITALS — BP 134/82 | HR 82 | Temp 98.4°F | Resp 18 | Ht 72.0 in | Wt 195.0 lb

## 2015-05-21 DIAGNOSIS — Z23 Encounter for immunization: Secondary | ICD-10-CM

## 2015-05-21 DIAGNOSIS — J069 Acute upper respiratory infection, unspecified: Secondary | ICD-10-CM

## 2015-05-21 NOTE — Patient Instructions (Addendum)
Thank you for choosing [facility].  Summary/Instructions:  Continue to take your medications as needed for symptom relief.  If your symptoms worsen or fail to improve, please contact our office for further instruction, or in case of emergency go directly to the emergency room at the closest medical facility.   General Recommendations:    Please drink plenty of fluids.  Get plenty of rest   Sleep in humidified air  Use saline nasal sprays  Netti pot   OTC Medications:  Decongestants - helps relieve congestion   Flonase (generic fluticasone) or Nasacort (generic triamcinolone) - please make sure to use the "cross-over" technique at a 45 degree angle towards the opposite eye as opposed to straight up the nasal passageway.   Sudafed (generic pseudoephedrine - Note this is the one that is available behind the pharmacy counter); Products with phenylephrine (-PE) may also be used but is often not as effective as pseudoephedrine.   If you have HIGH BLOOD PRESSURE - Coricidin HBP; AVOID any product that is -D as this contains pseudoephedrine which may increase your blood pressure.  Afrin (oxymetazoline) every 6-8 hours for up to 3 days.   Allergies - helps relieve runny nose, itchy eyes and sneezing   Claritin (generic loratidine), Allegra (fexofenidine), or Zyrtec (generic cyrterizine) for runny nose. These medications should not cause drowsiness.  Note - Benadryl (generic diphenhydramine) may be used however may cause drowsiness  Cough -   Delsym or Robitussin (generic dextromethorphan)  Expectorants - helps loosen mucus to ease removal   Mucinex (generic guaifenesin) as directed on the package.  Headaches / General Aches   Tylenol (generic acetaminophen) - DO NOT EXCEED 3 grams (3,000 mg) in a 24 hour time period  Advil/Motrin (generic ibuprofen)   Sore Throat -   Salt water gargle   Chloraseptic (generic benzocaine) spray or lozenges / Sucrets  (generic dyclonine)      Upper Respiratory Infection, Adult An upper respiratory infection (URI) is also sometimes known as the common cold. The upper respiratory tract includes the nose, sinuses, throat, trachea, and bronchi. Bronchi are the airways leading to the lungs. Most people improve within 1 week, but symptoms can last up to 2 weeks. A residual cough may last even longer.  CAUSES Many different viruses can infect the tissues lining the upper respiratory tract. The tissues become irritated and inflamed and often become very moist. Mucus production is also common. A cold is contagious. You can easily spread the virus to others by oral contact. This includes kissing, sharing a glass, coughing, or sneezing. Touching your mouth or nose and then touching a surface, which is then touched by another person, can also spread the virus. SYMPTOMS  Symptoms typically develop 1 to 3 days after you come in contact with a cold virus. Symptoms vary from person to person. They may include: 4. Runny nose. 5. Sneezing. 6. Nasal congestion. 7. Sinus irritation. 8. Sore throat. 9. Loss of voice (laryngitis). 10. Cough. 11. Fatigue. 12. Muscle aches. 13. Loss of appetite. 14. Headache. 15. Low-grade fever. DIAGNOSIS  You might diagnose your own cold based on familiar symptoms, since most people get a cold 2 to 3 times a year. Your caregiver can confirm this based on your exam. Most importantly, your caregiver can check that your symptoms are not due to another disease such as strep throat, sinusitis, pneumonia, asthma, or epiglottitis. Blood tests, throat tests, and X-rays are not necessary to diagnose a common cold, but they may sometimes  be helpful in excluding other more serious diseases. Your caregiver will decide if any further tests are required. RISKS AND COMPLICATIONS  You may be at risk for a more severe case of the common cold if you smoke cigarettes, have chronic heart disease (such as heart  failure) or lung disease (such as asthma), or if you have a weakened immune system. The very young and very old are also at risk for more serious infections. Bacterial sinusitis, middle ear infections, and bacterial pneumonia can complicate the common cold. The common cold can worsen asthma and chronic obstructive pulmonary disease (COPD). Sometimes, these complications can require emergency medical care and may be life-threatening. PREVENTION  The best way to protect against getting a cold is to practice good hygiene. Avoid oral or hand contact with people with cold symptoms. Wash your hands often if contact occurs. There is no clear evidence that vitamin C, vitamin E, echinacea, or exercise reduces the chance of developing a cold. However, it is always recommended to get plenty of rest and practice good nutrition. TREATMENT  Treatment is directed at relieving symptoms. There is no cure. Antibiotics are not effective, because the infection is caused by a virus, not by bacteria. Treatment may include: 2. Increased fluid intake. Sports drinks offer valuable electrolytes, sugars, and fluids. 3. Breathing heated mist or steam (vaporizer or shower). 4. Eating chicken soup or other clear broths, and maintaining good nutrition. 5. Getting plenty of rest. 6. Using gargles or lozenges for comfort. 7. Controlling fevers with ibuprofen or acetaminophen as directed by your caregiver. 8. Increasing usage of your inhaler if you have asthma. Zinc gel and zinc lozenges, taken in the first 24 hours of the common cold, can shorten the duration and lessen the severity of symptoms. Pain medicines may help with fever, muscle aches, and throat pain. A variety of non-prescription medicines are available to treat congestion and runny nose. Your caregiver can make recommendations and may suggest nasal or lung inhalers for other symptoms.  HOME CARE INSTRUCTIONS  2. Only take over-the-counter or prescription medicines for  pain, discomfort, or fever as directed by your caregiver. 3. Use a warm mist humidifier or inhale steam from a shower to increase air moisture. This may keep secretions moist and make it easier to breathe. 4. Drink enough water and fluids to keep your urine clear or pale yellow. 5. Rest as needed. 6. Return to work when your temperature has returned to normal or as your caregiver advises. You may need to stay home longer to avoid infecting others. You can also use a face mask and careful hand washing to prevent spread of the virus. SEEK MEDICAL CARE IF:  3. After the first few days, you feel you are getting worse rather than better. 4. You need your caregiver's advice about medicines to control symptoms. 5. You develop chills, worsening shortness of breath, or brown or red sputum. These may be signs of pneumonia. 6. You develop yellow or brown nasal discharge or pain in the face, especially when you bend forward. These may be signs of sinusitis. 7. You develop a fever, swollen neck glands, pain with swallowing, or white areas in the back of your throat. These may be signs of strep throat. SEEK IMMEDIATE MEDICAL CARE IF:  3. You have a fever. 4. You develop severe or persistent headache, ear pain, sinus pain, or chest pain. 5. You develop wheezing, a prolonged cough, cough up blood, or have a change in your usual mucus (if you  have chronic lung disease). 6. You develop sore muscles or a stiff neck. Document Released: 02/14/2001 Document Revised: 11/13/2011 Document Reviewed: 11/26/2013 [facility] Patient Information 2015 [place], [place]. This information is not intended to replace advice given to you by your health care provider. Make sure you discuss any questions you have with your health care provider.

## 2015-05-21 NOTE — Progress Notes (Signed)
Subjective:    Patient ID: Christopher Horne, male    DOB: Jan 23, 1971, 44 y.o.   MRN: [id number]  Chief Complaint  Patient presents with  . Cough    cough, chest pain from coughing, congestion, just wanting to get checked    HPI:  Christopher Horne is a 44 y.o. male with a PMH of dyslipidemia, hypothyroidism, and insulin-dependent diabetes who presents today for an acute office visit.   This is a new problem. Associated symptoms of cough, chest pain from coughing, and congestion have been going on for about 2 days. Modiying factors include Tylenol, Vicks vapor rub, and robutussion which have helped with his symptoms. Denies any recent antibiotic use. Severity of the cough can wake up at night.   No Known Allergies   Current Outpatient Prescriptions on File Prior to Visit  Medication Sig Dispense Refill  . enalapril (VASOTEC) 5 MG tablet Take 1 tablet (5 mg total) by mouth daily. 90 tablet 1  . glucose blood (ONETOUCH VERIO) test strip Use as instructed to check blood sugar 3 times per day dx code 100 each 3  . ibuprofen (ADVIL,MOTRIN) 800 MG tablet Take 1 tablet (800 mg total) by mouth 3 (three) times daily. 21 tablet 0  . Insulin Lispro Prot & Lispro (HUMALOG MIX 75/25 KWIKPEN) (75-25) 100 UNIT/ML Kwikpen Inject 100 Units into the skin daily with breakfast. And pen needles 2/day 45 mL 11  . Insulin Pen Needle (B-D UF III MINI PEN NEEDLES) 31G X 5 MM MISC Use to inject insulin 2/day. 100 each 2  . levothyroxine (SYNTHROID, LEVOTHROID) 25 MCG tablet Take 1 tablet (25 mcg total) by mouth daily before breakfast. 90 tablet 1  . lovastatin (MEVACOR) 20 MG tablet Take 1 tablet (20 mg total) by mouth at bedtime. 90 tablet 1  . ONETOUCH DELICA LANCETS FINE MISC Use to check blood sugar 3 times per day dx code E11.9 100 each 3   No current facility-administered medications on file prior to visit.    Review of Systems  Constitutional: Positive for fever. Negative for chills.  HENT: Positive  for congestion, sinus pressure and sore throat. Negative for ear pain.   Respiratory: Positive for cough. Negative for chest tightness and shortness of breath.   Cardiovascular: Negative for chest pain, palpitations and leg swelling.  Neurological: Positive for headaches.      Objective:    BP 134/82 mmHg  Pulse 82  Temp(Src) 98.4 F (36.9 C) (Oral)  Resp 18  Ht 6' (1.829 m)  Wt 195 lb (88.451 kg)  BMI 26.44 kg/m2  SpO2 95% Nursing note and vital signs reviewed.  Physical Exam  Constitutional: He is oriented to person, place, and time. He appears well-developed and well-nourished. No distress.  HENT:  Right Ear: Hearing, tympanic membrane, external ear and ear canal normal.  Left Ear: Hearing, tympanic membrane, external ear and ear canal normal.  Nose: Nose normal. Right sinus exhibits no maxillary sinus tenderness and no frontal sinus tenderness. Left sinus exhibits no maxillary sinus tenderness and no frontal sinus tenderness.  Mouth/Throat: Uvula is midline, oropharynx is clear and moist and mucous membranes are normal.  Cardiovascular: Normal rate, regular rhythm, normal heart sounds and intact distal pulses.   Pulmonary/Chest: Effort normal and breath sounds normal.  Neurological: He is alert and oriented to person, place, and time.  Skin: Skin is warm and dry.  Psychiatric: He has a normal mood and affect. His behavior is normal. Judgment and thought content normal.  Assessment & Plan:   Problem List Items Addressed This Visit      Respiratory   Acute upper respiratory infection - Primary    Symptoms and exam consistent with an upper respiratory infection. Continue over-the-counter medications as needed for symptom relief and supportive care. Follow-up if symptoms worsen or fail to improve.

## 2015-05-21 NOTE — Assessment & Plan Note (Signed)
Symptoms and exam consistent with an upper respiratory infection. Continue over-the-counter medications as needed for symptom relief and supportive care. Follow-up if symptoms worsen or fail to improve.

## 2015-05-21 NOTE — Progress Notes (Signed)
Pre visit review using our clinic review tool, if applicable. No additional management support is needed unless otherwise documented below in the visit note. 

## 2015-08-09 ENCOUNTER — Other Ambulatory Visit: Payer: Self-pay | Admitting: Family

## 2015-12-12 ENCOUNTER — Other Ambulatory Visit: Payer: Self-pay | Admitting: Family

## 2015-12-27 ENCOUNTER — Other Ambulatory Visit: Payer: Self-pay

## 2015-12-27 MED ORDER — INSULIN LISPRO PROT & LISPRO (75-25 MIX) 100 UNIT/ML KWIKPEN: 100.0000 [IU] | PEN_INJECTOR | Freq: Every day | SUBCUTANEOUS | Status: DC

## 2015-12-27 NOTE — Telephone Encounter (Signed)
Rx request for Humalog 75/25 came in. Last appointment was 01/11/2015. Pt was given a 30 day supply and advised to make an appointment for further refills.

## 2016-01-19 DIAGNOSIS — E785 Hyperlipidemia, unspecified: Secondary | ICD-10-CM | POA: Diagnosis not present

## 2016-01-19 DIAGNOSIS — E1065 Type 1 diabetes mellitus with hyperglycemia: Secondary | ICD-10-CM | POA: Diagnosis not present

## 2016-01-19 DIAGNOSIS — E039 Hypothyroidism, unspecified: Secondary | ICD-10-CM | POA: Diagnosis not present

## 2016-01-19 DIAGNOSIS — Z794 Long term (current) use of insulin: Secondary | ICD-10-CM | POA: Diagnosis not present

## 2016-02-17 ENCOUNTER — Encounter (HOSPITAL_COMMUNITY): Payer: Self-pay | Admitting: Emergency Medicine

## 2016-02-17 ENCOUNTER — Emergency Department (HOSPITAL_COMMUNITY): Payer: 59

## 2016-02-17 ENCOUNTER — Emergency Department (HOSPITAL_COMMUNITY)
Admission: EM | Admit: 2016-02-17 | Discharge: 2016-02-17 | Disposition: A | Discharge status: Home / Self Care | Payer: 59 | Attending: Emergency Medicine | Admitting: Emergency Medicine

## 2016-02-17 DIAGNOSIS — Y9389 Activity, other specified: Secondary | ICD-10-CM | POA: Diagnosis not present

## 2016-02-17 DIAGNOSIS — Z794 Long term (current) use of insulin: Secondary | ICD-10-CM | POA: Insufficient documentation

## 2016-02-17 DIAGNOSIS — E785 Hyperlipidemia, unspecified: Secondary | ICD-10-CM | POA: Insufficient documentation

## 2016-02-17 DIAGNOSIS — Y92410 Unspecified street and highway as the place of occurrence of the external cause: Secondary | ICD-10-CM | POA: Diagnosis not present

## 2016-02-17 DIAGNOSIS — M79602 Pain in left arm: Secondary | ICD-10-CM | POA: Insufficient documentation

## 2016-02-17 DIAGNOSIS — Y999 Unspecified external cause status: Secondary | ICD-10-CM | POA: Diagnosis not present

## 2016-02-17 DIAGNOSIS — R51 Headache: Secondary | ICD-10-CM | POA: Diagnosis not present

## 2016-02-17 DIAGNOSIS — E119 Type 2 diabetes mellitus without complications: Secondary | ICD-10-CM | POA: Insufficient documentation

## 2016-02-17 LAB — CBG MONITORING, ED: GLUCOSE-CAPILLARY: 248 mg/dL — AB (ref 65–99)

## 2016-02-17 MED ORDER — IBUPROFEN 200 MG PO TABS
600.0000 mg | ORAL_TABLET | Freq: Once | ORAL | Status: AC
  Administered 2016-02-17: 600 mg via ORAL
  Filled 2016-02-17: qty 3

## 2016-02-17 MED ORDER — METHOCARBAMOL 500 MG PO TABS: 500.0000 mg | ORAL_TABLET | Freq: Two times a day (BID) | ORAL | Status: DC

## 2016-02-17 MED ORDER — HYDROCODONE-ACETAMINOPHEN 5-325 MG PO TABS
1.0000 | ORAL_TABLET | Freq: Once | ORAL | Status: AC
  Administered 2016-02-17: 1 via ORAL
  Filled 2016-02-17: qty 1

## 2016-02-17 MED ORDER — HYDROCODONE-ACETAMINOPHEN 5-325 MG PO TABS: 1.0000 | ORAL_TABLET | ORAL | Status: DC | PRN

## 2016-02-17 MED ORDER — IBUPROFEN 600 MG PO TABS: 600.0000 mg | ORAL_TABLET | Freq: Four times a day (QID) | ORAL | Status: DC | PRN

## 2016-02-17 NOTE — Discharge Instructions (Signed)
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
## 2016-02-25 ENCOUNTER — Other Ambulatory Visit: Payer: Self-pay | Admitting: Endocrinology

## 2016-02-25 ENCOUNTER — Other Ambulatory Visit: Payer: Self-pay | Admitting: Family

## 2016-03-22 DIAGNOSIS — Z79899 Other long term (current) drug therapy: Secondary | ICD-10-CM | POA: Diagnosis not present

## 2016-03-22 DIAGNOSIS — R0602 Shortness of breath: Secondary | ICD-10-CM | POA: Diagnosis not present

## 2016-03-22 DIAGNOSIS — M79605 Pain in left leg: Secondary | ICD-10-CM | POA: Diagnosis not present

## 2016-03-22 DIAGNOSIS — Z7982 Long term (current) use of aspirin: Secondary | ICD-10-CM | POA: Diagnosis not present

## 2016-03-22 DIAGNOSIS — I10 Essential (primary) hypertension: Secondary | ICD-10-CM | POA: Diagnosis not present

## 2016-03-22 DIAGNOSIS — E860 Dehydration: Secondary | ICD-10-CM | POA: Diagnosis not present

## 2016-03-22 DIAGNOSIS — M79604 Pain in right leg: Secondary | ICD-10-CM | POA: Diagnosis not present

## 2016-03-22 DIAGNOSIS — E079 Disorder of thyroid, unspecified: Secondary | ICD-10-CM | POA: Diagnosis not present

## 2016-03-22 DIAGNOSIS — Z794 Long term (current) use of insulin: Secondary | ICD-10-CM | POA: Diagnosis not present

## 2016-03-22 DIAGNOSIS — E1165 Type 2 diabetes mellitus with hyperglycemia: Secondary | ICD-10-CM | POA: Diagnosis not present

## 2016-07-19 ENCOUNTER — Other Ambulatory Visit: Payer: Self-pay | Admitting: *Deleted

## 2016-07-21 ENCOUNTER — Other Ambulatory Visit (INDEPENDENT_AMBULATORY_CARE_PROVIDER_SITE_OTHER): Payer: BLUE CROSS/BLUE SHIELD

## 2016-07-21 ENCOUNTER — Encounter: Payer: Self-pay | Admitting: Family

## 2016-07-21 ENCOUNTER — Ambulatory Visit (INDEPENDENT_AMBULATORY_CARE_PROVIDER_SITE_OTHER): Payer: BLUE CROSS/BLUE SHIELD | Admitting: Family

## 2016-07-21 VITALS — BP 116/84 | HR 84 | Temp 97.9°F | Resp 16 | Ht 72.0 in | Wt 202.1 lb

## 2016-07-21 DIAGNOSIS — E119 Type 2 diabetes mellitus without complications: Secondary | ICD-10-CM | POA: Diagnosis not present

## 2016-07-21 DIAGNOSIS — IMO0001 Reserved for inherently not codable concepts without codable children: Secondary | ICD-10-CM

## 2016-07-21 DIAGNOSIS — Z794 Long term (current) use of insulin: Secondary | ICD-10-CM

## 2016-07-21 DIAGNOSIS — Z0000 Encounter for general adult medical examination without abnormal findings: Secondary | ICD-10-CM

## 2016-07-21 LAB — COMPREHENSIVE METABOLIC PANEL
ALBUMIN: 4.2 g/dL (ref 3.5–5.2)
ALK PHOS: 57 U/L (ref 39–117)
ALT: 16 U/L (ref 0–53)
AST: 16 U/L (ref 0–37)
BILIRUBIN TOTAL: 0.3 mg/dL (ref 0.2–1.2)
BUN: 11 mg/dL (ref 6–23)
CALCIUM: 9.4 mg/dL (ref 8.4–10.5)
CO2: 29 mEq/L (ref 19–32)
CREATININE: 1.24 mg/dL (ref 0.40–1.50)
Chloride: 106 mEq/L (ref 96–112)
GFR: 81.69 mL/min (ref >60.00)
Glucose, Bld: 88 mg/dL (ref 70–99)
Potassium: 4.2 mEq/L (ref 3.5–5.1)
Sodium: 141 mEq/L (ref 135–145)
Total Protein: 6.9 g/dL (ref 6.0–8.3)

## 2016-07-21 LAB — CBC
HCT: 41.5 % (ref 39.0–52.0)
HEMOGLOBIN: 13.7 g/dL (ref 13.0–17.0)
MCHC: 32.9 g/dL (ref 30.0–36.0)
MCV: 83 fl (ref 78.0–100.0)
PLATELETS: 354 10*3/uL (ref 150.0–400.0)
RBC: 5 Mil/uL (ref 4.22–5.81)
RDW: 14.9 % (ref 11.5–15.5)
WBC: 5.5 10*3/uL (ref 4.0–10.5)

## 2016-07-21 LAB — LIPID PANEL
CHOLESTEROL: 198 mg/dL (ref 0–200)
HDL: 47.7 mg/dL (ref >39.00)
LDL Cholesterol: 137 mg/dL — ABNORMAL HIGH (ref 0–99)
NonHDL: 150.17
TRIGLYCERIDES: 68 mg/dL (ref 0.0–149.0)
Total CHOL/HDL Ratio: 4
VLDL: 13.6 mg/dL (ref 0.0–40.0)

## 2016-07-21 LAB — TSH: TSH: 1.13 u[IU]/mL (ref 0.35–4.50)

## 2016-07-21 LAB — MICROALBUMIN / CREATININE URINE RATIO
CREATININE, U: 184.6 mg/dL
MICROALB UR: 2.8 mg/dL — AB (ref 0.0–1.9)
MICROALB/CREAT RATIO: 1.5 mg/g (ref 0.0–30.0)

## 2016-07-21 LAB — HEMOGLOBIN A1C: Hgb A1c MFr Bld: 11.2 % — ABNORMAL HIGH (ref 4.6–6.5)

## 2016-07-21 NOTE — Progress Notes (Signed)
Subjective:    Patient ID: Christopher Horne, male    DOB: [birth year], 45 y.o.   MRN: [id number]  Chief Complaint  Patient presents with  . CPE    fasting    HPI:  Christopher Horne is a 45 y.o. male who presents today for an annual wellness visit.   1) Health Maintenance -   Diet - Averages about 4-5 times per day small meals consisting of regular diet; No caffeine intake  Exercise - No structured exercise; does manual labor at work    2) Corporate investment bankerreventative Exams / Immunizations:  Dental -- Up to date  Vision -- Due for exam   Health Maintenance  Topic Date Due  . PNEUMOCOCCAL POLYSACCHARIDE VACCINE (1) 03/18/1975  . HIV Screening  03/17/1988  . HEMOGLOBIN A1C  04/02/2015  . OPHTHALMOLOGY EXAM  07/22/2015  . FOOT EXAM  01/11/2016  . INFLUENZA VACCINE  12/02/2016 (Originally 04/04/2016)  . TETANUS/TDAP  10/05/2021    Immunization History  Administered Date(s) Administered  . Influenza,inj,Quad PF,36+ Mos 05/21/2015  . Pneumococcal Conjugate-13 05/14/2014     No Known Allergies   Outpatient Medications Prior to Visit  Medication Sig Dispense Refill  . enalapril (VASOTEC) 5 MG tablet TAKE ONE TABLET BY MOUTH ONCE DAILY 90 tablet 0  . glucose blood (ONETOUCH VERIO) test strip Use as instructed to check blood sugar 3 times per day dx code 100 each 3  . ibuprofen (ADVIL,MOTRIN) 800 MG tablet Take 1 tablet (800 mg total) by mouth 3 (three) times daily. 21 tablet 0  . Insulin Lispro Prot & Lispro (HUMALOG MIX 75/25 KWIKPEN) (75-25) 100 UNIT/ML Kwikpen Inject 100 Units into the skin daily with breakfast. And pen needles 2/day APPOINTMENT NEEDED FOR FURTHER REFILLS 30 mL 0  . Insulin Pen Needle (B-D UF III MINI PEN NEEDLES) 31G X 5 MM MISC Use to inject insulin 2/day. 100 each 2  . levothyroxine (SYNTHROID, LEVOTHROID) 25 MCG tablet Take 1 tablet (25 mcg total) by mouth daily before breakfast. --patient needs office visit before any further refills 90 tablet 0  . lovastatin  (MEVACOR) 20 MG tablet Take 1 tablet (20 mg total) by mouth at bedtime. 90 tablet 1  . ONETOUCH DELICA LANCETS FINE MISC Use to check blood sugar 3 times per day dx code E11.9 100 each 3  . HYDROcodone-acetaminophen (NORCO/VICODIN) 5-325 MG tablet Take 1 tablet by mouth every 4 (four) hours as needed for severe pain. 6 tablet 0  . ibuprofen (ADVIL,MOTRIN) 600 MG tablet Take 1 tablet (600 mg total) by mouth every 6 (six) hours as needed. 30 tablet 0  . methocarbamol (ROBAXIN) 500 MG tablet Take 1 tablet (500 mg total) by mouth 2 (two) times daily. 20 tablet 0   No facility-administered medications prior to visit.      Past Medical History:  Diagnosis Date  . Diabetes mellitus without complication (HCC)   . Hyperlipidemia   . Thyroid disease      Past Surgical History:  Procedure Laterality Date  . EYE SURGERY       Family History  Problem Relation Age of Onset  . Healthy Mother   . Diabetes Maternal Grandmother   . Diabetes Paternal Grandmother      Social History   Social History  . Marital status: Married    Spouse name: N/A  . Number of children: 0  . Years of education: 14   Occupational History  . Fabricator    Social History Main Topics  . Smoking  status: Never Smoker  . Smokeless tobacco: Never Used  . Alcohol use Yes     Comment: once a month, beer  . Drug use: No  . Sexual activity: Not on file   Other Topics Concern  . Not on file   Social History Narrative   Currently lives with his wife. Fun: Go to the beach   Denies religious beliefs effecting health care.       Review of Systems  Constitutional: Denies fever, chills, fatigue, or significant weight gain/loss. HENT: Head: Denies headache or neck pain Ears: Denies changes in hearing, ringing in ears, earache, drainage Nose: Denies discharge, stuffiness, itching, nosebleed, sinus pain Throat: Denies sore throat, hoarseness, dry mouth, sores, thrush Eyes: Denies loss/changes in vision, pain,  redness, blurry/double vision, flashing lights Cardiovascular: Denies chest pain/discomfort, tightness, palpitations, shortness of breath with activity, difficulty lying down, swelling, sudden awakening with shortness of breath Respiratory: Denies shortness of breath, cough, sputum production, wheezing Gastrointestinal: Denies dysphasia, heartburn, change in appetite, nausea, change in bowel habits, rectal bleeding, constipation, diarrhea, yellow skin or eyes Genitourinary: Denies frequency, urgency, burning/pain, blood in urine, incontinence, change in urinary strength. Musculoskeletal: Denies muscle/joint pain, stiffness, back pain, redness or swelling of joints, trauma Skin: Denies rashes, lumps, itching, dryness, color changes, or hair/nail changes Neurological: Denies dizziness, fainting, seizures, weakness, numbness, tingling, tremor Psychiatric - Denies nervousness, stress, depression or memory loss Endocrine: Denies heat or cold intolerance, sweating, frequent urination, excessive thirst, changes in appetite Hematologic: Denies ease of bruising or bleeding     Objective:     BP 116/84 (BP Location: Left Arm, Patient Position: Sitting, Cuff Size: Large)   Pulse 84   Temp 97.9 F (36.6 C) (Oral)   Resp 16   Ht 6' (1.829 m)   Wt 202 lb 1.9 oz (91.7 kg)   SpO2 97%   BMI 27.41 kg/m  Nursing note and vital signs reviewed.  Physical Exam  Constitutional: He is oriented to person, place, and time. He appears well-developed and well-nourished.  HENT:  Head: Normocephalic.  Right Ear: Hearing, tympanic membrane, external ear and ear canal normal.  Left Ear: Hearing, tympanic membrane, external ear and ear canal normal.  Nose: Nose normal.  Mouth/Throat: Uvula is midline, oropharynx is clear and moist and mucous membranes are normal.  Eyes: Conjunctivae and EOM are normal. Pupils are equal, round, and reactive to light.  Neck: Neck supple. No JVD present. No tracheal deviation  present. No thyromegaly present.  Cardiovascular: Normal rate, regular rhythm, normal heart sounds and intact distal pulses.   Pulmonary/Chest: Effort normal and breath sounds normal.  Abdominal: Soft. Bowel sounds are normal. He exhibits no distension and no mass. There is no tenderness. There is no rebound and no guarding.  Musculoskeletal: Normal range of motion. He exhibits no edema or tenderness.  Lymphadenopathy:    He has no cervical adenopathy.  Neurological: He is alert and oriented to person, place, and time. He has normal reflexes. No cranial nerve deficit. He exhibits normal muscle tone. Coordination normal.  Skin: Skin is warm and dry.  Psychiatric: He has a normal mood and affect. His behavior is normal. Judgment and thought content normal.       Assessment & Plan:   Problem List Items Addressed This Visit      Endocrine   Insulin dependent diabetes mellitus (HCC)   Relevant Orders   Ambulatory referral to Endocrinology   Hemoglobin A1c   Urine Microalbumin w/creat. ratio  Other   Routine general medical examination at a health care facility - Primary    1) Anticipatory Guidance: Discussed importance of wearing a seatbelt while driving and not texting while driving; changing batteries in smoke detector at least once annually; wearing suntan lotion when outside; eating a balanced and moderate diet; getting physical activity at least 30 minutes per day.  2) Immunizations / Screenings / Labs:  Declines flu shot. All other immunizations are up-to-date per recommendations. Due for a diabetic eye exam encouraged to be completed independently. Obtain A1c and microalbumin for diabetes screening. Diabetic foot exam completed. Obtain TSH for hypothyroidism screenings and therapeutic drug management. All other screenings are up-to-date per recommendations. Obtain CBC, CMET, and lipid profile.    Overall well exam with risk factors for cardiovascular disease including  dyslipidemia and diabetes. His right physically active at work. Currently working approximately 58 hours per week. Encouraged to take some time off from work to provide burnout. Diabetes is followed by endocrinology. Hyper lipidemia will be checked with lipid profile today. Other healthy lifestyle behaviors and choices. Follow-up prevention exam in 1 year. Follow-up office visit pending blood work and for chronic conditions.       Relevant Orders   CBC   Comprehensive metabolic panel   Lipid panel   Hemoglobin A1c   Urine Microalbumin w/creat. ratio   TSH       I have discontinued Mr. Darlyn ChamberChambers's HYDROcodone-acetaminophen and methocarbamol. I am also having him maintain his ibuprofen, lovastatin, Insulin Pen Needle, ONETOUCH DELICA LANCETS FINE, glucose blood, enalapril, Insulin Lispro Prot & Lispro, and levothyroxine.   Follow-up: Return in about 6 months (around 01/18/2017), or if symptoms worsen or fail to improve.   Jeanine Luzalone, Gregory, FNP

## 2016-07-21 NOTE — Patient Instructions (Addendum)
Thank you for choosing [facility].  SUMMARY AND INSTRUCTIONS:  Medication:  Please continue to take your medications as prescribed.   Your prescription(s) have been submitted to your pharmacy or been printed and provided for you. Please take as directed and contact our office if you believe you are having problem(s) with the medication(s) or have any questions.  Labs:  Please stop by the lab on the lower level of the building for your blood work. Your results will be released to MyChart (or called to you) after review, usually within 72 hours after test completion. If any changes need to be made, you will be notified at that same time.  1.) The lab is open from 7:30am to 5:30 pm Monday-Friday 2.) No appointment is necessary 3.) Fasting (if needed) is 6-8 hours after food and drink; black coffee and water are okay   Referrals:  They will call to schedule your appointment with Endocrinology.  Referrals have been made during this visit. You should expect to hear back from our schedulers in about 7-10 days in regards to establishing an appointment with the specialists we discussed.   Follow up:  If your symptoms worsen or fail to improve, please contact our office for further instruction, or in case of emergency go directly to the emergency room at the closest medical facility.    Health Maintenance, Male A healthy lifestyle and preventative care can promote health and wellness.  Maintain regular health, dental, and eye exams.  Eat a healthy diet. Foods like vegetables, fruits, whole grains, low-fat dairy products, and lean protein foods contain the nutrients you need and are low in calories. Decrease your intake of foods high in solid fats, added sugars, and salt. Get information about a proper diet from your health care provider, if necessary.  Regular physical exercise is one of the most important things you can do for your health. Most adults should get at least 150 minutes  of moderate-intensity exercise (any activity that increases your heart rate and causes you to sweat) each week. In addition, most adults need muscle-strengthening exercises on 2 or more days a week.   Maintain a healthy weight. The body mass index (BMI) is a screening tool to identify possible weight problems. It provides an estimate of body fat based on height and weight. Your health care provider can find your BMI and can help you achieve or maintain a healthy weight. For males 20 years and older:  A BMI below 18.5 is considered underweight.  A BMI of 18.5 to 24.9 is normal.  A BMI of 25 to 29.9 is considered overweight.  A BMI of 30 and above is considered obese.  Maintain normal blood lipids and cholesterol by exercising and minimizing your intake of saturated fat. Eat a balanced diet with plenty of fruits and vegetables. Blood tests for lipids and cholesterol should begin at age 24 and be repeated every 5 years. If your lipid or cholesterol levels are high, you are over age 65, or you are at high risk for heart disease, you may need your cholesterol levels checked more frequently.Ongoing high lipid and cholesterol levels should be treated with medicines if diet and exercise are not working.  If you smoke, find out from your health care provider how to quit. If you do not use tobacco, do not start.  Lung cancer screening is recommended for adults aged 55-80 years who are at high risk for developing lung cancer because of a history of smoking. A yearly  low-dose CT scan of the lungs is recommended for people who have at least a 30-pack-year history of smoking and are current smokers or have quit within the past 15 years. A pack year of smoking is smoking an average of 1 pack of cigarettes a day for 1 year (for example, a 30-pack-year history of smoking could mean smoking 1 pack a day for 30 years or 2 packs a day for 15 years). Yearly screening should continue until the smoker has stopped smoking  for at least 15 years. Yearly screening should be stopped for people who develop a health problem that would prevent them from having lung cancer treatment.  If you choose to drink alcohol, do not have more than 2 drinks per day. One drink is considered to be 12 oz (360 mL) of beer, 5 oz (150 mL) of wine, or 1.5 oz (45 mL) of liquor.  Avoid the use of street drugs. Do not share needles with anyone. Ask for help if you need support or instructions about stopping the use of drugs.  High blood pressure causes heart disease and increases the risk of stroke. High blood pressure is more likely to develop in:  People who have blood pressure in the end of the normal range (100-139/85-89 mm Hg).  People who are overweight or obese.  People who are African American.  If you are 5418-43 years of age, have your blood pressure checked every 3-5 years. If you are 43 years of age or older, have your blood pressure checked every year. You should have your blood pressure measured twice-once when you are at a hospital or clinic, and once when you are not at a hospital or clinic. Record the average of the two measurements. To check your blood pressure when you are not at a hospital or clinic, you can use:  An automated blood pressure machine at a pharmacy.  A home blood pressure monitor.  If you are 6245-43 years old, ask your health care provider if you should take aspirin to prevent heart disease.  Diabetes screening involves taking a blood sample to check your fasting blood sugar level. This should be done once every 3 years after age 43 if you are at a normal weight and without risk factors for diabetes. Testing should be considered at a younger age or be carried out more frequently if you are overweight and have at least 1 risk factor for diabetes.  Colorectal cancer can be detected and often prevented. Most routine colorectal cancer screening begins at the age of 43 and continues through age 43. However, your  health care provider may recommend screening at an earlier age if you have risk factors for colon cancer. On a yearly basis, your health care provider may provide home test kits to check for hidden blood in the stool. A small camera at the end of a tube may be used to directly examine the colon (sigmoidoscopy or colonoscopy) to detect the earliest forms of colorectal cancer. Talk to your health care provider about this at age 43 when routine screening begins. A direct exam of the colon should be repeated every 5-10 years through age 43, unless early forms of precancerous polyps or small growths are found.  People who are at an increased risk for hepatitis B should be screened for this virus. You are considered at high risk for hepatitis B if:  You were born in a country where hepatitis B occurs often. Talk with your health care provider about which  countries are considered high risk.  Your parents were born in a high-risk country and you have not received a shot to protect against hepatitis B (hepatitis B vaccine).  You have HIV or AIDS.  You use needles to inject street drugs.  You live with, or have sex with, someone who has hepatitis B.  You are a man who has sex with other men (MSM).  You get hemodialysis treatment.  You take certain medicines for conditions like cancer, organ transplantation, and autoimmune conditions.  Hepatitis C blood testing is recommended for all people born from [id number] through 1965 and any individual with known risk factors for hepatitis C.  Healthy men should no longer receive prostate-specific antigen (PSA) blood tests as part of routine cancer screening. Talk to your health care provider about prostate cancer screening.  Testicular cancer screening is not recommended for adolescents or adult males who have no symptoms. Screening includes self-exam, a health care provider exam, and other screening tests. Consult with your health care provider about any symptoms you  have or any concerns you have about testicular cancer.  Practice safe sex. Use condoms and avoid high-risk sexual practices to reduce the spread of sexually transmitted infections (STIs).  You should be screened for STIs, including gonorrhea and chlamydia if:  You are sexually active and are younger than 24 years.  You are older than 24 years, and your health care provider tells you that you are at risk for this type of infection.  Your sexual activity has changed since you were last screened, and you are at an increased risk for chlamydia or gonorrhea. Ask your health care provider if you are at risk.  If you are at risk of being infected with HIV, it is recommended that you take a prescription medicine daily to prevent HIV infection. This is called pre-exposure prophylaxis (PrEP). You are considered at risk if:  You are a man who has sex with other men (MSM).  You are a heterosexual man who is sexually active with multiple partners.  You take drugs by injection.  You are sexually active with a partner who has HIV.  Talk with your health care provider about whether you are at high risk of being infected with HIV. If you choose to begin PrEP, you should first be tested for HIV. You should then be tested every 3 months for as long as you are taking PrEP.  Use sunscreen. Apply sunscreen liberally and repeatedly throughout the day. You should seek shade when your shadow is shorter than you. Protect yourself by wearing long sleeves, pants, a wide-brimmed hat, and sunglasses year round whenever you are outdoors.  Tell your health care provider of new moles or changes in moles, especially if there is a change in shape or color. Also, tell your health care provider if a mole is larger than the size of a pencil eraser.  A one-time screening for abdominal aortic aneurysm (AAA) and surgical repair of large AAAs by ultrasound is recommended for men aged 65-75 years who are current or former  smokers.  Stay current with your vaccines (immunizations). This information is not intended to replace advice given to you by your health care provider. Make sure you discuss any questions you have with your health care provider. Document Released: 02/17/2008 Document Revised: 09/11/2014 Document Reviewed: 05/25/2015 Elsevier Interactive Patient Education  2017 [employer]

## 2016-07-21 NOTE — Assessment & Plan Note (Signed)
1) Anticipatory Guidance: Discussed importance of wearing a seatbelt while driving and not texting while driving; changing batteries in smoke detector at least once annually; wearing suntan lotion when outside; eating a balanced and moderate diet; getting physical activity at least 30 minutes per day.  2) Immunizations / Screenings / Labs:  Declines flu shot. All other immunizations are up-to-date per recommendations. Due for a diabetic eye exam encouraged to be completed independently. Obtain A1c and microalbumin for diabetes screening. Diabetic foot exam completed. Obtain TSH for hypothyroidism screenings and therapeutic drug management. All other screenings are up-to-date per recommendations. Obtain CBC, CMET, and lipid profile.    Overall well exam with risk factors for cardiovascular disease including dyslipidemia and diabetes. His right physically active at work. Currently working approximately 58 hours per week. Encouraged to take some time off from work to provide burnout. Diabetes is followed by endocrinology. Hyper lipidemia will be checked with lipid profile today. Other healthy lifestyle behaviors and choices. Follow-up prevention exam in 1 year. Follow-up office visit pending blood work and for chronic conditions.

## 2016-07-23 ENCOUNTER — Other Ambulatory Visit: Payer: Self-pay | Admitting: Family

## 2016-07-23 MED ORDER — LEVOTHYROXINE SODIUM 25 MCG PO TABS: 25.0000 ug | ORAL_TABLET | Freq: Every day | ORAL | 0 refills | Status: DC

## 2016-07-23 MED ORDER — LOVASTATIN 20 MG PO TABS: 20.0000 mg | ORAL_TABLET | Freq: Every day | ORAL | 1 refills | Status: DC

## 2016-08-08 ENCOUNTER — Other Ambulatory Visit: Payer: Self-pay

## 2016-08-08 MED ORDER — LEVOTHYROXINE SODIUM 25 MCG PO TABS: 25.0000 ug | ORAL_TABLET | Freq: Every day | ORAL | 1 refills | Status: DC

## 2016-08-18 ENCOUNTER — Encounter: Payer: Self-pay | Admitting: Endocrinology

## 2016-08-25 ENCOUNTER — Telehealth: Payer: Self-pay

## 2016-08-25 NOTE — Telephone Encounter (Signed)
DMV forms have been faxed to the number listed on the form.

## 2016-08-29 ENCOUNTER — Telehealth: Payer: Self-pay

## 2016-08-29 NOTE — Telephone Encounter (Signed)
Patient was referred to our office by Jeanine LuzGregory Calone, but patient is requesting to see Dr. Elvera LennoxGherghe. Patient was seen by Dr. Everardo AllEllison in September of 2016. Please advise if switching providers would be ok. Thanks!

## 2016-08-29 NOTE — Telephone Encounter (Signed)
OK 

## 2016-08-31 NOTE — Telephone Encounter (Signed)
Caitlin, Could you call this patient and schedule his new appointment with Dr. Elvera LennoxGherghe?

## 2016-08-31 NOTE — Telephone Encounter (Signed)
Fine with me

## 2016-09-05 ENCOUNTER — Telehealth: Payer: Self-pay | Admitting: Endocrinology

## 2016-09-05 NOTE — Telephone Encounter (Signed)
LVM for Pt to call and schedule appointment with Dr. Elvera LennoxGherghe.

## 2016-09-27 ENCOUNTER — Telehealth: Payer: Self-pay | Admitting: Family

## 2016-09-27 NOTE — Telephone Encounter (Signed)
Forms have been faxed several times to the number listed on the cover sheet. Pt advised that I call wife to get the number to fax paperwork. Left wife VM to call back with that information

## 2016-09-27 NOTE — Telephone Encounter (Signed)
Patient has not heard anything back in regard to [facility].  Notes under FYI show Tammy SoursGreg still has this.  Please contact patient in regard.

## 2016-10-17 ENCOUNTER — Encounter: Payer: Self-pay | Admitting: Internal Medicine

## 2016-10-17 ENCOUNTER — Ambulatory Visit (INDEPENDENT_AMBULATORY_CARE_PROVIDER_SITE_OTHER): Payer: BLUE CROSS/BLUE SHIELD | Admitting: Internal Medicine

## 2016-10-17 VITALS — BP 140/92 | HR 91 | Ht 71.0 in | Wt 207.0 lb

## 2016-10-17 DIAGNOSIS — Z794 Long term (current) use of insulin: Secondary | ICD-10-CM | POA: Diagnosis not present

## 2016-10-17 DIAGNOSIS — E119 Type 2 diabetes mellitus without complications: Secondary | ICD-10-CM | POA: Diagnosis not present

## 2016-10-17 DIAGNOSIS — IMO0001 Reserved for inherently not codable concepts without codable children: Secondary | ICD-10-CM

## 2016-10-17 MED ORDER — METFORMIN HCL 500 MG PO TABS: 1000.0000 mg | ORAL_TABLET | Freq: Two times a day (BID) | ORAL | 3 refills | Status: DC

## 2016-10-17 MED ORDER — INSULIN GLARGINE 100 UNIT/ML SOLOSTAR PEN: 50.0000 [IU] | PEN_INJECTOR | Freq: Every day | SUBCUTANEOUS | 5 refills | Status: DC

## 2016-10-17 MED ORDER — GLIPIZIDE 5 MG PO TABS: 5.0000 mg | ORAL_TABLET | Freq: Two times a day (BID) | ORAL | 3 refills | Status: DC
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
## 2016-10-17 NOTE — Patient Instructions (Signed)
Please stop 75/25 insulin.  Please start: - Lantus 50 units at bedtime. - Glipizide 5 mg before b'fast and before dinner - Metformin 500 mg with dinner x 4 days. If you tolerate this well, add another Metformin tablet (500 mg) with breakfast x 4 days. If you tolerate this well, add another metformin tablet with dinner (total 1000 mg) x 4 days. If you tolerate this well, add another metformin tablet with breakfast (total 1000 mg). Continue with 1000 mg of metformin 2x a day with breakfast and dinner.  Please let me know if the sugars are consistently <80 or >200.  Please return in 1.5 months with your sugar log.   PATIENT INSTRUCTIONS FOR TYPE 2 DIABETES:  **Please join MyChart!** - see attached instructions about how to join if you have not done so already.  DIET AND EXERCISE Diet and exercise is an important part of diabetic treatment.  We recommended aerobic exercise in the form of brisk walking (working between 40-60% of maximal aerobic capacity, similar to brisk walking) for 150 minutes per week (such as 30 minutes five days per week) along with 3 times per week performing 'resistance' training (using various gauge rubber tubes with handles) 5-10 exercises involving the major muscle groups (upper body, lower body and core) performing 10-15 repetitions (or near fatigue) each exercise. Start at half the above goal but build slowly to reach the above goals. If limited by weight, joint pain, or disability, we recommend daily walking in a swimming pool with water up to waist to reduce pressure from joints while allow for adequate exercise.    BLOOD GLUCOSES Monitoring your blood glucoses is important for continued management of your diabetes. Please check your blood glucoses 2-4 times a day: fasting, before meals and at bedtime (you can rotate these measurements - e.g. one day check before the 3 meals, the next day check before 2 of the meals and before bedtime, etc.).   HYPOGLYCEMIA (low blood  sugar) Hypoglycemia is usually a reaction to not eating, exercising, or taking too much insulin/ other diabetes drugs.  Symptoms include tremors, sweating, hunger, confusion, headache, etc. Treat IMMEDIATELY with 15 grams of Carbs: . 4 glucose tablets .  cup regular juice/soda . 2 tablespoons raisins . 4 teaspoons sugar . 1 tablespoon honey Recheck blood glucose in 15 mins and repeat above if still symptomatic/blood glucose <100.  RECOMMENDATIONS TO REDUCE YOUR RISK OF DIABETIC COMPLICATIONS: * Take your prescribed MEDICATION(S) * Follow a DIABETIC diet: Complex carbs, fiber rich foods, (monounsaturated and polyunsaturated) fats * AVOID saturated/trans fats, high fat foods, >2,300 mg salt per day. * EXERCISE at least 5 times a week for 30 minutes or preferably daily.  * DO NOT SMOKE OR DRINK more than 1 drink a day. * Check your FEET every day. Do not wear tightfitting shoes. Contact us if you develop an ulcer * See your EYE doctor once a year or more if needed * Get a FLU shot once a year * Get a PNEUMONIA vaccine once before and once after age 44 years  GOALS:  * Your Hemoglobin A1c of <7%  * fasting sugars need to be <130 * after meals sugars need to be <180 (2h after you start eating) * Your Systolic BP should be 140 or lower  * Your Diastolic BP should be 80 or lower  * Your HDL (Good Cholesterol) should be 40 or higher  * Your LDL (Bad Cholesterol) should be 100 or lower. * Your Triglycerides should be  150 or lower  * Your Urine microalbumin (kidney function) should be <30 * Your Body Mass Index should be 25 or lower    Please consider the following ways to cut down carbs and fat and increase fiber and micronutrients in your diet: - substitute whole grain for white bread or pasta - substitute brown rice for white rice - substitute 90-calorie flat bread pieces for slices of bread when possible - substitute sweet potatoes or yams for white potatoes - substitute humus for  margarine - substitute tofu for cheese when possible - substitute almond or rice milk for regular milk (would not drink soy milk daily due to concern for soy estrogen influence on breast cancer risk) - substitute dark chocolate for other sweets when possible - substitute water - can add lemon or orange slices for taste - for diet sodas (artificial sweeteners will trick your body that you can eat sweets without getting calories and will lead you to overeating and weight gain in the long run) - do not skip breakfast or other meals (this will slow down the metabolism and will result in more weight gain over time)  - can try smoothies made from fruit and almond/rice milk in am instead of regular breakfast - can also try old-fashioned (not instant) oatmeal made with almond/rice milk in am - order the dressing on the side when eating salad at a restaurant (pour less than half of the dressing on the salad) - eat as little meat as possible - can try juicing, but should not forget that juicing will get rid of the fiber, so would alternate with eating raw veg./fruits or drinking smoothies - use as little oil as possible, even when using olive oil - can dress a salad with a mix of balsamic vinegar and lemon juice, for e.g. - use agave nectar, stevia sugar, or regular sugar rather than artificial sweateners - steam or broil/roast veggies  - snack on veggies/fruit/nuts (unsalted, preferably) when possible, rather than processed foods - reduce or eliminate aspartame in diet (it is in diet sodas, chewing gum, etc) Read the labels!  Try to read Dr. Katherina Right book: "Program for Reversing Diabetes" for other ideas for healthy eating.

## 2016-11-29 DIAGNOSIS — Z0289 Encounter for other administrative examinations: Secondary | ICD-10-CM

## 2016-12-05 ENCOUNTER — Ambulatory Visit: Payer: BLUE CROSS/BLUE SHIELD | Admitting: Internal Medicine

## 2017-02-12 ENCOUNTER — Other Ambulatory Visit: Payer: Self-pay | Admitting: Internal Medicine

## 2017-04-03 ENCOUNTER — Other Ambulatory Visit: Payer: Self-pay | Admitting: Family

## 2017-05-08 DIAGNOSIS — E1065 Type 1 diabetes mellitus with hyperglycemia: Secondary | ICD-10-CM | POA: Diagnosis not present

## 2017-05-08 DIAGNOSIS — E11649 Type 2 diabetes mellitus with hypoglycemia without coma: Secondary | ICD-10-CM | POA: Diagnosis not present

## 2017-05-08 DIAGNOSIS — E039 Hypothyroidism, unspecified: Secondary | ICD-10-CM | POA: Diagnosis not present

## 2017-05-22 DIAGNOSIS — E1065 Type 1 diabetes mellitus with hyperglycemia: Secondary | ICD-10-CM | POA: Diagnosis not present

## 2017-05-29 ENCOUNTER — Telehealth: Payer: Self-pay | Admitting: Internal Medicine

## 2017-05-29 ENCOUNTER — Telehealth: Payer: Self-pay

## 2017-05-29 NOTE — Telephone Encounter (Signed)
Called pt in regards to forms dropped off from department of transportation. LVM letting pt know he needs to be seen for forms to be filled out since it has been almost a year since he was last seen. Advised for pt to call back to get an appointment set up.

## 2017-05-29 NOTE — Telephone Encounter (Signed)
Pt would like his a1c documented on our letterhead for the dmv please call him when this is completed. # [phone] thank you

## 2017-05-30 NOTE — Telephone Encounter (Signed)
Called patient and advised that I had the letter waiting for him to pick up. Patient's wife advised that he would come pick it up today.

## 2017-06-01 ENCOUNTER — Ambulatory Visit: Payer: BLUE CROSS/BLUE SHIELD | Admitting: Family
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
## 2017-09-06 ENCOUNTER — Encounter: Payer: Self-pay | Admitting: Internal Medicine

## 2017-09-06 ENCOUNTER — Encounter: Payer: Self-pay | Admitting: Emergency Medicine

## 2017-09-06 ENCOUNTER — Ambulatory Visit: Payer: BLUE CROSS/BLUE SHIELD | Admitting: Internal Medicine

## 2017-09-06 VITALS — BP 112/68 | HR 96 | Temp 98.8°F | Resp 16 | Wt 202.0 lb

## 2017-09-06 DIAGNOSIS — B349 Viral infection, unspecified: Secondary | ICD-10-CM

## 2017-09-06 LAB — POCT INFLUENZA A/B
Influenza A, POC: NEGATIVE
Influenza B, POC: NEGATIVE

## 2017-09-06 MED ORDER — BLOOD GLUCOSE MONITOR KIT: PACK | 0 refills | Status: DC

## 2017-09-06 NOTE — Assessment & Plan Note (Signed)
Symptoms consistent with a viral infection Rapid flu negative Symptomatic treatment Discussed risk of dehydration and advised that he needs to monitor his sugars more carefully and may not need as much insulin Call with any questions Note given for work Call if no improvement

## 2017-09-06 NOTE — Progress Notes (Signed)
Subjective:    Patient ID: Christopher Horne, male    DOB: 10-05-1970, 47 y.o.   MRN: [id number]  HPI He is here for an acute visit.    Fatigue:  He started feeling fatigued a couple of days ago.  He woke up 2-3 days ago and laid back down and fell back to sleep.  He was exhausted and could not stay awake.  He slept all day.  He has felt tired since then.  Yesterday he was chilled all day.  He has had several episodes of diarrhea, nausea and abdominal pain.  Mild nasal congestion, sinus pressure, headaches and body aches.  He denies any known fever, ear pain, sore throat, cough, wheeze, shortness of breath, chest pain not related to reflux, palpitations, urinary symptoms.  Because of his symptoms he is not been able to go to work.  Medications and allergies reviewed with patient and updated if appropriate.  Patient Active Problem List   Diagnosis Date Noted  . Routine general medical examination at a health care facility 07/21/2016  . Acute upper respiratory infection 05/21/2015  . Fatigue 12/23/2014  . Dyslipidemia 12/11/2014  . Insulin dependent diabetes mellitus (HCC) 06/15/2014  . Hypothyroidism 06/01/2014  . Unspecified essential hypertension 06/01/2014    Current Outpatient Medications on File Prior to Visit  Medication Sig Dispense Refill  . enalapril (VASOTEC) 5 MG tablet TAKE 1 TABLET BY MOUTH DAILY 30 tablet 0  . glipiZIDE (GLUCOTROL) 5 MG tablet Take 1 tablet (5 mg total) by mouth 2 (two) times daily before a meal. 60 tablet 3  . glucose blood (ONETOUCH VERIO) test strip Use as instructed to check blood sugar 3 times per day dx code 100 each 3  . ibuprofen (ADVIL,MOTRIN) 800 MG tablet Take 1 tablet (800 mg total) by mouth 3 (three) times daily. 21 tablet 0  . Insulin Glargine (LANTUS SOLOSTAR) 100 UNIT/ML Solostar Pen Inject 50 Units into the skin daily at 10 pm. 15 mL 5  . Insulin Pen Needle (B-D UF III MINI PEN NEEDLES) 31G X 5 MM MISC Use to inject insulin 2/day.  100 each 2  . levothyroxine (SYNTHROID, LEVOTHROID) 25 MCG tablet TAKE 1 TABLET (25 MCG TOTAL) BY MOUTH DAILY BEFORE BREAKFAST. 90 tablet 0  . lovastatin (MEVACOR) 20 MG tablet Take 1 tablet (20 mg total) by mouth at bedtime. 90 tablet 1  . metFORMIN (GLUCOPHAGE) 500 MG tablet Take 2 tablets (1,000 mg total) by mouth 2 (two) times daily with a meal. 180 tablet 3  . ONETOUCH DELICA LANCETS FINE MISC Use to check blood sugar 3 times per day dx code E11.9 100 each 3   No current facility-administered medications on file prior to visit.     Past Medical History:  Diagnosis Date  . Diabetes mellitus without complication (HCC)   . Hyperlipidemia   . Thyroid disease     Past Surgical History:  Procedure Laterality Date  . EYE SURGERY      Social History   Socioeconomic History  . Marital status: Married    Spouse name: None  . Number of children: 0  . Years of education: 62  . Highest education level: None  Social Needs  . Financial resource strain: None  . Food insecurity - worry: None  . Food insecurity - inability: None  . Transportation needs - medical: None  . Transportation needs - non-medical: None  Occupational History  . Occupation: Fabricator  Tobacco Use  . Smoking status: Never Smoker  .  Smokeless tobacco: Never Used  Substance and Sexual Activity  . Alcohol use: Yes    Comment: once a month, beer  . Drug use: No  . Sexual activity: None  Other Topics Concern  . None  Social History Narrative   Currently lives with his wife. Fun: Go to the beach   Denies religious beliefs effecting health care.     Family History  Problem Relation Age of Onset  . Healthy Mother   . Diabetes Maternal Grandmother   . Diabetes Paternal Grandmother     Review of Systems  Constitutional: Positive for fatigue. Negative for chills and fever.  HENT: Positive for congestion (chronic, mild) and sinus pain (between eyes). Negative for ear pain and sore throat.   Eyes: Positive  for visual disturbance (intermittent blurriness from diabetes).  Respiratory: Negative for cough, shortness of breath and wheezing.   Cardiovascular: Positive for chest pain (from reflux). Negative for palpitations and leg swelling.  Gastrointestinal: Positive for abdominal pain (new in past couple of days), diarrhea (new in last couple of days) and nausea. Negative for blood in stool and vomiting.  Genitourinary: Negative for dysuria and hematuria.  Musculoskeletal: Positive for arthralgias, joint swelling and myalgias.  Neurological: Positive for light-headedness (occ when he stands) and headaches. Negative for dizziness.       Objective:   Vitals:   09/06/17 1050  BP: 112/68  Pulse: 96  Resp: 16  Temp: 98.8 F (37.1 C)  SpO2: 97%   Wt Readings from Last 3 Encounters:  09/06/17 202 lb (91.6 kg)  10/17/16 207 lb (93.9 kg)  07/21/16 202 lb 1.9 oz (91.7 kg)   Body mass index is 28.17 kg/m.   Physical Exam    GENERAL APPEARANCE: Appears stated age, well appearing, NAD EYES: conjunctiva clear, no icterus HEENT: bilateral tympanic membranes and ear canals normal, oropharynx with no erythema, no thyromegaly, trachea midline, no cervical or supraclavicular lymphadenopathy LUNGS: Clear to auscultation without wheeze or crackles, unlabored breathing, good air entry bilaterally CARDIOVASCULAR: Normal S1,S2 without murmurs, no edema Abdomen: soft, non tender, non distended SKIN: Warm, dry      Assessment & Plan:    See Problem List for Assessment and Plan of chronic medical problems.

## 2017-09-06 NOTE — Patient Instructions (Addendum)
Your test for flu is negative.  You likely have a viral illness similar to the flu.  There are no prescription medications for this.   Take tylenol, advil and other over the counter cold medications as needed.  Increase your rest and fluids  Monitor your sugars closely.   Call if no improvement   Viral Illness, Adult Viruses are tiny germs that can get into a person's body and cause illness. There are many different types of viruses, and they cause many types of illness. Viral illnesses can range from mild to severe. They can affect various parts of the body. Common illnesses that are caused by a virus include colds and the flu. Viral illnesses also include serious conditions such as HIV/AIDS (human immunodeficiency virus/acquired immunodeficiency syndrome). A few viruses have been linked to certain cancers. What are the causes? Many types of viruses can cause illness. Viruses invade cells in your body, multiply, and cause the infected cells to malfunction or die. When the cell dies, it releases more of the virus. When this happens, you develop symptoms of the illness, and the virus continues to spread to other cells. If the virus takes over the function of the cell, it can cause the cell to divide and grow out of control, as is the case when a virus causes cancer. Different viruses get into the body in different ways. You can get a virus by:  Swallowing food or water that is contaminated with the virus.  Breathing in droplets that have been coughed or sneezed into the air by an infected person.  Touching a surface that has been contaminated with the virus and then touching your eyes, nose, or mouth.  Being bitten by an insect or animal that carries the virus.  Having sexual contact with a person who is infected with the virus.  Being exposed to blood or fluids that contain the virus, either through an open cut or during a transfusion.  If a virus enters your body, your body's defense  system (immune system) will try to fight the virus. You may be at higher risk for a viral illness if your immune system is weak. What are the signs or symptoms? Symptoms vary depending on the type of virus and the location of the cells that it invades. Common symptoms of the main types of viral illnesses include: Cold and flu viruses  Fever.  Headache.  Sore throat.  Muscle aches.  Nasal congestion.  Cough. Digestive system (gastrointestinal) viruses  Fever.  Abdominal pain.  Nausea.  Diarrhea. Liver viruses (hepatitis)  Loss of appetite.  Tiredness.  Yellowing of the skin (jaundice). Brain and spinal cord viruses  Fever.  Headache.  Stiff neck.  Nausea and vomiting.  Confusion or sleepiness. Skin viruses  Warts.  Itching.  Rash. Sexually transmitted viruses  Discharge.  Swelling.  Redness.  Rash. How is this treated? Viruses can be difficult to treat because they live within cells. Antibiotic medicines do not treat viruses because these drugs do not get inside cells. Treatment for a viral illness may include:  Resting and drinking plenty of fluids.  Medicines to relieve symptoms. These can include over-the-counter medicine for pain and fever, medicines for cough or congestion, and medicines to relieve diarrhea.  Antiviral medicines. These drugs are available only for certain types of viruses. They may help reduce flu symptoms if taken early. There are also many antiviral medicines for hepatitis and HIV/AIDS.  Some viral illnesses can be prevented with vaccinations. A common example  is the flu shot. Follow these instructions at home: Medicines   Take over-the-counter and prescription medicines only as told by your health care provider.  If you were prescribed an antiviral medicine, take it as told by your health care provider. Do not stop taking the medicine even if you start to feel better.  Be aware of when antibiotics are needed and when  they are not needed. Antibiotics do not treat viruses. If your health care provider thinks that you may have a bacterial infection as well as a viral infection, you may get an antibiotic. ? Do not ask for an antibiotic prescription if you have been diagnosed with a viral illness. That will not make your illness go away faster. ? Frequently taking antibiotics when they are not needed can lead to antibiotic resistance. When this develops, the medicine no longer works against the bacteria that it normally fights. General instructions  Drink enough fluids to keep your urine clear or pale yellow.  Rest as much as possible.  Return to your normal activities as told by your health care provider. Ask your health care provider what activities are safe for you.  Keep all follow-up visits as told by your health care provider. This is important. How is this prevented? Take these actions to reduce your risk of viral infection:  Eat a healthy diet and get enough rest.  Wash your hands often with soap and water. This is especially important when you are in public places. If soap and water are not available, use hand sanitizer.  Avoid close contact with friends and family who have a viral illness.  If you travel to areas where viral gastrointestinal infection is common, avoid drinking water or eating raw food.  Keep your immunizations up to date. Get a flu shot every year as told by your health care provider.  Do not share toothbrushes, nail clippers, razors, or needles with other people.  Always practice safe sex.  Contact a health care provider if:  You have symptoms of a viral illness that do not go away.  Your symptoms come back after going away.  Your symptoms get worse. Get help right away if:  You have trouble breathing.  You have a severe headache or a stiff neck.  You have severe vomiting or abdominal pain. This information is not intended to replace advice given to you by your  health care provider. Make sure you discuss any questions you have with your health care provider. Document Released: 12/31/2015 Document Revised: 02/02/2016 Document Reviewed: 12/31/2015 Elsevier Interactive Patient Education  [employer]2018 [employer]

## 2017-12-06 ENCOUNTER — Encounter: Payer: Self-pay | Admitting: Family Medicine

## 2017-12-06 ENCOUNTER — Ambulatory Visit: Payer: BLUE CROSS/BLUE SHIELD | Admitting: Family Medicine

## 2017-12-06 VITALS — BP 136/74 | HR 79 | Temp 98.5°F | Ht 71.0 in | Wt 206.0 lb

## 2017-12-06 DIAGNOSIS — J029 Acute pharyngitis, unspecified: Secondary | ICD-10-CM | POA: Diagnosis not present

## 2017-12-06 NOTE — Assessment & Plan Note (Signed)
Likely viral in nature - counseled on supportive care - given indications to follow up 

## 2017-12-06 NOTE — Progress Notes (Signed)
Christopher BlancoRichard Horne - 47 y.o. male MRN [id number]  Date of birth: 1971-08-13  SUBJECTIVE:  Including CC & ROS.  Chief Complaint  Patient presents with  . Sore Throat    Christopher Horne is a 47 y.o. male that is presenting with a sore throat. Ongoing three days. Admits to body aches.  Denies fevers. He has been taking motrin and allegra. He feels like he is loosing his voice, hard to talk at times. Admits to headaches.   Review of Systems  Constitutional: Negative for fever.  HENT: Positive for congestion and voice change.   Respiratory: Positive for cough.   Cardiovascular: Negative for chest pain.    HISTORY: Past Medical, Surgical, Social, and Family History Reviewed & Updated per EMR.   Pertinent Historical Findings include:  Past Medical History:  Diagnosis Date  . Diabetes mellitus without complication (HCC)   . Hyperlipidemia   . Thyroid disease     Past Surgical History:  Procedure Laterality Date  . EYE SURGERY      No Known Allergies  Family History  Problem Relation Age of Onset  . Healthy Mother   . Diabetes Maternal Grandmother   . Diabetes Paternal Grandmother      Social History   Socioeconomic History  . Marital status: Married    Spouse name: Not on file  . Number of children: 0  . Years of education: 4914  . Highest education level: Not on file  Occupational History  . Occupation: Psychologist, occupationalabricator  Social Needs  . Financial resource strain: Not on file  . Food insecurity:    Worry: Not on file    Inability: Not on file  . Transportation needs:    Medical: Not on file    Non-medical: Not on file  Tobacco Use  . Smoking status: Never Smoker  . Smokeless tobacco: Never Used  Substance and Sexual Activity  . Alcohol use: Yes    Comment: once a month, beer  . Drug use: No  . Sexual activity: Not on file  Lifestyle  . Physical activity:    Days per week: Not on file    Minutes per session: Not on file  . Stress: Not on file  Relationships  .  Social connections:    Talks on phone: Not on file    Gets together: Not on file    Attends religious service: Not on file    Active member of club or organization: Not on file    Attends meetings of clubs or organizations: Not on file    Relationship status: Not on file  . Intimate partner violence:    Fear of current or ex partner: Not on file    Emotionally abused: Not on file    Physically abused: Not on file    Forced sexual activity: Not on file  Other Topics Concern  . Not on file  Social History Narrative   Currently lives with his wife. Fun: Go to the beach   Denies religious beliefs effecting health care.      PHYSICAL EXAM:  VS: BP 136/74 (BP Location: Left Arm, Patient Position: Sitting, Cuff Size: Normal)   Pulse 79   Temp 98.5 F (36.9 C) (Oral)   Ht 5\' 11"  (1.803 m)   Wt 206 lb (93.4 kg)   SpO2 98%   BMI 28.73 kg/m  Physical Exam Gen: NAD, alert, cooperative with exam,  ENT: normal lips, normal nasal mucosa, tympanic membranes clear and intact bilaterally, normal oropharynx, no tonsillar  exudates Eye: normal EOM, normal conjunctiva and lids CV:  no edema, +2 pedal pulses, regular rate and rhythm, S1-S2   Resp: no accessory muscle use, non-labored, clear to auscultation bilaterally, no crackles or wheezes Skin: no rashes, no areas of induration  Neuro: normal tone, normal sensation to touch Psych:  normal insight, alert and oriented MSK: Normal gait, normal strength       ASSESSMENT & PLAN:   Sore throat Likely viral in nature - counseled on supportive care  - given indications to followup

## 2017-12-06 NOTE — Patient Instructions (Signed)
Please try things such as zyrtec-D or allegra-D which is an antihistamine and decongestant.   Please try afrin which will help with nasal congestion but use for only three days.   Please also try using a netti pot on a regular occasion.  Honey can help with a sore throat.   Chloraseptic spray and lozenges can help with the sore throat.  Please let me know if you have symptoms that last longer than a week or you develop a fever.

## 2018-02-26 DIAGNOSIS — E1065 Type 1 diabetes mellitus with hyperglycemia: Secondary | ICD-10-CM | POA: Diagnosis not present

## 2018-02-27 DIAGNOSIS — E079 Disorder of thyroid, unspecified: Secondary | ICD-10-CM | POA: Diagnosis not present

## 2018-02-27 DIAGNOSIS — R51 Headache: Secondary | ICD-10-CM | POA: Diagnosis not present

## 2018-02-27 DIAGNOSIS — Z7982 Long term (current) use of aspirin: Secondary | ICD-10-CM | POA: Diagnosis not present

## 2018-02-27 DIAGNOSIS — R5381 Other malaise: Secondary | ICD-10-CM | POA: Diagnosis not present

## 2018-02-27 DIAGNOSIS — Z794 Long term (current) use of insulin: Secondary | ICD-10-CM | POA: Diagnosis not present

## 2018-02-27 DIAGNOSIS — I10 Essential (primary) hypertension: Secondary | ICD-10-CM | POA: Diagnosis not present

## 2018-02-27 DIAGNOSIS — Z79899 Other long term (current) drug therapy: Secondary | ICD-10-CM | POA: Diagnosis not present

## 2018-02-27 DIAGNOSIS — E1165 Type 2 diabetes mellitus with hyperglycemia: Secondary | ICD-10-CM | POA: Diagnosis not present

## 2018-03-21 DIAGNOSIS — E1065 Type 1 diabetes mellitus with hyperglycemia: Secondary | ICD-10-CM | POA: Diagnosis not present

## 2018-03-21 DIAGNOSIS — E039 Hypothyroidism, unspecified: Secondary | ICD-10-CM | POA: Diagnosis not present

## 2018-10-23 ENCOUNTER — Other Ambulatory Visit: Payer: Self-pay

## 2018-10-23 ENCOUNTER — Encounter: Payer: Self-pay | Admitting: Family Medicine

## 2018-10-23 ENCOUNTER — Ambulatory Visit (INDEPENDENT_AMBULATORY_CARE_PROVIDER_SITE_OTHER): Payer: BLUE CROSS/BLUE SHIELD | Admitting: Family Medicine

## 2018-10-23 VITALS — BP 120/68 | HR 95 | Temp 98.4°F | Ht 71.0 in | Wt 196.8 lb

## 2018-10-23 DIAGNOSIS — R059 Cough, unspecified: Secondary | ICD-10-CM

## 2018-10-23 DIAGNOSIS — R05 Cough: Secondary | ICD-10-CM

## 2018-10-23 MED ORDER — DOXYCYCLINE HYCLATE 100 MG PO CAPS: 100.0000 mg | ORAL_CAPSULE | Freq: Two times a day (BID) | ORAL | 0 refills | Status: DC

## 2018-10-23 NOTE — Patient Instructions (Addendum)
Plenty of fluids and rest  Take the Doxycycline twice daily- take with some food but non-dairy  Follow up for any fever or increased shortness of breath.

## 2018-10-23 NOTE — Progress Notes (Signed)
  Subjective:     Patient ID: Christopher Horne, male   DOB: [birth year], 48 y.o.   MRN: [id number]  HPI Patient is non-smoker who is seen with upper respiratory symptoms.  He states that over 2 weeks ago he developed typical URI type symptoms.  He seemed to be getting better and then a few days ago started having increased sweats along with some low-grade fever and worsening cough.  Productive of gray-colored mucus.  Occasional chills.  Mild dyspnea.  No chest pains.  Patient is non-smoker.  No facial pain.  No nausea or vomiting.  His chronic problems include history of diabetes which is insulin-dependent as well as apparently hypothyroidism and dyslipidemia.  He is followed by endocrinology  Past Medical History:  Diagnosis Date  . Diabetes mellitus without complication (HCC)   . Hyperlipidemia   . Thyroid disease    Past Surgical History:  Procedure Laterality Date  . EYE SURGERY      reports that he has never smoked. He has never used smokeless tobacco. He reports current alcohol use. He reports that he does not use drugs. family history includes Diabetes in his maternal grandmother and paternal grandmother; Healthy in his mother. No Known Allergies   Review of Systems  Constitutional: Positive for chills, fatigue and fever.  HENT: Positive for congestion.   Respiratory: Positive for cough and shortness of breath. Negative for wheezing.   Cardiovascular: Negative for chest pain, palpitations and leg swelling.       Objective:   Physical Exam Constitutional:      Appearance: Normal appearance.  HENT:     Right Ear: Tympanic membrane normal.     Left Ear: Tympanic membrane normal.     Mouth/Throat:     Pharynx: Oropharynx is clear. No oropharyngeal exudate or posterior oropharyngeal erythema.  Neck:     Musculoskeletal: Neck supple.  Cardiovascular:     Rate and Rhythm: Normal rate and regular rhythm.  Pulmonary:     Effort: Pulmonary effort is normal.     Breath sounds:  Normal breath sounds. No wheezing or rales.  Neurological:     Mental Status: He is alert.        Assessment:     Patient is seen with worsening respiratory symptoms of cough following some initial improvement from what sounds like viral URI.  Even though he has no fever currently and nonfocal exam (and no respiratory distress),  worsening symptoms after initial improvement dictate consideration for antibiotic.Marland Kitchen  He is nontoxic in appearance    Plan:     -Doxycycline 100 mg twice daily for 10 days -Plenty of fluids and rest -Work note was written to keep out today and tomorrow -Follow-up for any worsening symptoms or if symptoms not improving over the next few days  Kristian Covey MD [facility]

## 2019-05-01 ENCOUNTER — Other Ambulatory Visit: Payer: Self-pay | Admitting: Family Medicine

## 2019-05-01 NOTE — Telephone Encounter (Signed)
Pt requesting refill of Novolog insulin. Pt has not established care at [facility] and was seen for an acute visit on 10/23/18. Pt does not have a future appt scheduled to establish care. Advised [facility] agent that the pt would need to establish care before medications could be refilled. [facility] agent, Janett scheduled pt for a new patient appt at [place] office. Advised [facility] agent that he would need to call his previous provider for a refill of insulin and if he was not able to contact the provider he would need to go to urgent care. Pt told [facility] agent that he did not remember who previously prescribed his medication. Pt advised to be seen in Urgent Care due to being out of medication at this time. New pt appt scheduled on 05/09/19.

## 2019-05-01 NOTE — Telephone Encounter (Signed)
Copied from [place] [id number] [id number]. Topic: Quick Communication - Rx Refill/Question >> May 01, 2019  4:41 PM Rainey Pines A wrote: Medication: Novolog Insulin  Has the patient contacted their pharmacy? Yes (Agent: If no, request that the patient contact the pharmacy for the refill.) (Agent: If yes, when and what did the pharmacy advise?)Contact PCP  Preferred Pharmacy (with phone number or street name): [id number] [place], [place] [phone] (Phone) [phone] (Fax)    Agent: Please be advised that RX refills may take up to 3 business days. We ask that you follow-up with your pharmacy.

## 2019-05-01 NOTE — Telephone Encounter (Signed)
Patient advised to go to Urgent care tonight for refill prescription for insulin ,per triage nurse  Larene [place]. Patient was scheduled for np appointment on 05/09/19, with Dr Zigmund Daniel at [facility] .

## 2019-05-09 ENCOUNTER — Encounter: Payer: Self-pay | Admitting: Family Medicine

## 2019-05-09 ENCOUNTER — Other Ambulatory Visit: Payer: Self-pay

## 2019-05-09 ENCOUNTER — Ambulatory Visit: Payer: BC Managed Care – PPO | Admitting: Family Medicine

## 2019-05-09 VITALS — BP 116/70 | HR 97 | Temp 98.2°F | Ht 72.0 in | Wt 184.6 lb

## 2019-05-09 DIAGNOSIS — Z794 Long term (current) use of insulin: Secondary | ICD-10-CM

## 2019-05-09 DIAGNOSIS — E039 Hypothyroidism, unspecified: Secondary | ICD-10-CM

## 2019-05-09 DIAGNOSIS — E785 Hyperlipidemia, unspecified: Secondary | ICD-10-CM | POA: Diagnosis not present

## 2019-05-09 DIAGNOSIS — E119 Type 2 diabetes mellitus without complications: Secondary | ICD-10-CM

## 2019-05-09 DIAGNOSIS — I10 Essential (primary) hypertension: Secondary | ICD-10-CM

## 2019-05-09 DIAGNOSIS — IMO0001 Reserved for inherently not codable concepts without codable children: Secondary | ICD-10-CM

## 2019-05-09 LAB — POCT URINALYSIS DIPSTICK
Bilirubin, UA: NEGATIVE
Blood, UA: NEGATIVE
Glucose, UA: POSITIVE — AB
Ketones, UA: POSITIVE
Leukocytes, UA: NEGATIVE
Nitrite, UA: NEGATIVE
Protein, UA: NEGATIVE
Spec Grav, UA: 1.01 (ref 1.010–1.025)
Urobilinogen, UA: 0.2 E.U./dL
pH, UA: 6.5 (ref 5.0–8.0)

## 2019-05-09 MED ORDER — NOVOLOG MIX 70/30 FLEXPEN (70-30) 100 UNIT/ML ~~LOC~~ SUPN: 35.0000 [IU] | PEN_INJECTOR | Freq: Two times a day (BID) | SUBCUTANEOUS | 3 refills | Status: DC

## 2019-05-09 MED ORDER — LEVOTHYROXINE SODIUM 25 MCG PO TABS: 25.0000 ug | ORAL_TABLET | Freq: Every day | ORAL | 0 refills | Status: DC

## 2019-05-09 NOTE — Patient Instructions (Signed)
Diabetes Mellitus and Exercise Exercising regularly is important for your overall health, especially when you have diabetes (diabetes mellitus). Exercising is not only about losing weight. It has many other health benefits, such as increasing muscle strength and bone density and reducing body fat and stress. This leads to improved fitness, flexibility, and endurance, all of which result in better overall health. Exercise has additional benefits for people with diabetes, including:  Reducing appetite.  Helping to lower and control blood glucose.  Lowering blood pressure.  Helping to control amounts of fatty substances (lipids) in the blood, such as cholesterol and triglycerides.  Helping the body to respond better to insulin (improving insulin sensitivity).  Reducing how much insulin the body needs.  Decreasing the risk for heart disease by: ? Lowering cholesterol and triglyceride levels. ? Increasing the levels of good cholesterol. ? Lowering blood glucose levels. What is my activity plan? Your health care provider or certified diabetes educator can help you make a plan for the type and frequency of exercise (activity plan) that works for you. Make sure that you:  Do at least 150 minutes of moderate-intensity or vigorous-intensity exercise each week. This could be brisk walking, biking, or water aerobics. ? Do stretching and strength exercises, such as yoga or weightlifting, at least 2 times a week. ? Spread out your activity over at least 3 days of the week.  Get some form of physical activity every day. ? Do not go more than 2 days in a row without some kind of physical activity. ? Avoid being inactive for more than 30 minutes at a time. Take frequent breaks to walk or stretch.  Choose a type of exercise or activity that you enjoy, and set realistic goals.  Start slowly, and gradually increase the intensity of your exercise over time. What do I need to know about managing my  diabetes?   Check your blood glucose before and after exercising. ? If your blood glucose is 240 mg/dL (13.3 mmol/L) or higher before you exercise, check your urine for ketones. If you have ketones in your urine, do not exercise until your blood glucose returns to normal. ? If your blood glucose is 100 mg/dL (5.6 mmol/L) or lower, eat a snack containing 15-20 grams of carbohydrate. Check your blood glucose 15 minutes after the snack to make sure that your level is above 100 mg/dL (5.6 mmol/L) before you start your exercise.  Know the symptoms of low blood glucose (hypoglycemia) and how to treat it. Your risk for hypoglycemia increases during and after exercise. Common symptoms of hypoglycemia can include: ? Hunger. ? Anxiety. ? Sweating and feeling clammy. ? Confusion. ? Dizziness or feeling light-headed. ? Increased heart rate or palpitations. ? Blurry vision. ? Tingling or numbness around the mouth, lips, or tongue. ? Tremors or shakes. ? Irritability.  Keep a rapid-acting carbohydrate snack available before, during, and after exercise to help prevent or treat hypoglycemia.  Avoid injecting insulin into areas of the body that are going to be exercised. For example, avoid injecting insulin into: ? The arms, when playing tennis. ? The legs, when jogging.  Keep records of your exercise habits. Doing this can help you and your health care provider adjust your diabetes management plan as needed. Write down: ? Food that you eat before and after you exercise. ? Blood glucose levels before and after you exercise. ? The type and amount of exercise you have done. ? When your insulin is expected to peak, if you use   insulin. Avoid exercising at times when your insulin is peaking.  When you start a new exercise or activity, work with your health care provider to make sure the activity is safe for you, and to adjust your insulin, medicines, or food intake as needed.  Drink plenty of water while  you exercise to prevent dehydration or heat stroke. Drink enough fluid to keep your urine clear or pale yellow. Summary  Exercising regularly is important for your overall health, especially when you have diabetes (diabetes mellitus).  Exercising has many health benefits, such as increasing muscle strength and bone density and reducing body fat and stress.  Your health care provider or certified diabetes educator can help you make a plan for the type and frequency of exercise (activity plan) that works for you.  When you start a new exercise or activity, work with your health care provider to make sure the activity is safe for you, and to adjust your insulin, medicines, or food intake as needed. This information is not intended to replace advice given to you by your health care provider. Make sure you discuss any questions you have with your health care provider. Document Released: 11/11/2003 Document Revised: 03/15/2017 Document Reviewed: 01/31/2016 Elsevier Patient Education  2020 Elsevier Inc.  

## 2019-05-10 LAB — COMPREHENSIVE METABOLIC PANEL
AG Ratio: 1.4 (calc) (ref 1.0–2.5)
ALT: 17 U/L (ref 9–46)
AST: 15 U/L (ref 10–40)
Albumin: 4.2 g/dL (ref 3.6–5.1)
Alkaline phosphatase (APISO): 77 U/L (ref 36–130)
BUN/Creatinine Ratio: 15 (calc) (ref 6–22)
BUN: 23 mg/dL (ref 7–25)
CO2: 27 mmol/L (ref 20–32)
Calcium: 9.4 mg/dL (ref 8.6–10.3)
Chloride: 88 mmol/L — ABNORMAL LOW (ref 98–110)
Creat: 1.56 mg/dL — ABNORMAL HIGH (ref 0.60–1.35)
Globulin: 2.9 g/dL (calc) (ref 1.9–3.7)
Glucose, Bld: 749 mg/dL (ref 65–99)
Potassium: 4.7 mmol/L (ref 3.5–5.3)
Sodium: 124 mmol/L — ABNORMAL LOW (ref 135–146)
Total Bilirubin: 0.4 mg/dL (ref 0.2–1.2)
Total Protein: 7.1 g/dL (ref 6.1–8.1)

## 2019-05-10 LAB — T4, FREE: Free T4: 1.3 ng/dL (ref 0.8–1.8)

## 2019-05-10 LAB — MICROALBUMIN / CREATININE URINE RATIO
Creatinine, Urine: 25 mg/dL (ref 20–320)
Microalb Creat Ratio: 88 mcg/mg creat — ABNORMAL HIGH (ref <30)
Microalb, Ur: 2.2 mg/dL

## 2019-05-10 LAB — LIPID PANEL
Cholesterol: 246 mg/dL — ABNORMAL HIGH (ref <200)
HDL: 46 mg/dL (ref >=40)
LDL Cholesterol (Calc): 153 mg/dL (calc) — ABNORMAL HIGH
Non-HDL Cholesterol (Calc): 200 mg/dL (calc) — ABNORMAL HIGH (ref <130)
Total CHOL/HDL Ratio: 5.3 (calc) — ABNORMAL HIGH (ref <5.0)
Triglycerides: 288 mg/dL — ABNORMAL HIGH (ref <150)

## 2019-05-10 LAB — HEMOGLOBIN A1C: Hgb A1c MFr Bld: >14 % of total Hgb — ABNORMAL HIGH (ref <5.7)

## 2019-05-10 LAB — TSH: TSH: 0.45 mIU/L (ref 0.40–4.50)

## 2019-05-12 ENCOUNTER — Encounter: Payer: Self-pay | Admitting: Family Medicine

## 2019-05-12 NOTE — Assessment & Plan Note (Signed)
-  It appears that he was previously taking lovastatin.  Update lipid panel today.

## 2019-05-12 NOTE — Assessment & Plan Note (Signed)
-  Poorly controlled.  He is symptomatic with increased thirst and urination.  -Rx for 70/30 insulin sent in, instructed to take as directed and check sugars twice daily.   -Recommend low carb diet.  Discussed how drinking Gatorade is likely worsening his blood sugars.  Recommend water or sugar free beverages-states he doesn't like these.  -Discussed importance of compliance to diet and using medication correctly -Reviewed consequences of poorly controlled diabetes including cardiovascular disease, renal disease, eye disease and neuropathy -Referral placed to endocrinology as well.

## 2019-05-12 NOTE — Assessment & Plan Note (Signed)
-  BP well controlled at this time, continue to monitor.

## 2019-05-12 NOTE — Progress Notes (Signed)
Christopher Horne - 48 y.o. male MRN [id number]  Date of birth: [birth year]  Subjective Chief Complaint  Patient presents with  . Establish Care    est care/ needs FMLA/ denies any shots/ needs insul 70/30 refiilled    HPI Christopher Horne is a 48 y.o. male with reported history of T2DM, HLD and hypothyroidism here today for initial visit.  He reports diagnosis of diabetes at age 30.  He believes that he has Type 1 diabetes, however states that he was started on metformin  for treatment of his diabetes initially along with insulin.  Appears that this is actually insulin dependent T2DM.   He stopped taking metformin as he stated someone told him it was ineffective.   He has been poorly compliant with follow up and medications.  He has been hospitalized in the past for hyperglycemia as well as hypoglycemia.  He has never had DKA that he is aware of. He has had FMLA paperwork for when he has episodes that his blood sugars are severely out of range.  He needs this completed today.  He has seen multiple endocrinologists in the past but has had difficulty following a prescribed regimen.    He is currently prescribed Novolog 70/30 35 units BID.  He tells me that he basically does whatever he wants in regards to diet.  He has been taught carb counting but never does this.  He reports that he has been out of insulin for the past couple of days.  He has not checked his blood sugar but admits to feeling thirsty all the time with increased urination.  He has been drinking a lot of gatorades to help with his thirst.    In regards to his other health conditions he has not been taking levothyroxine either.  He does not recall ever taking a statin for his cholesterol.   No Known Allergies  Past Medical History:  Diagnosis Date  . Diabetes mellitus without complication ([place])   . Hyperlipidemia   . Thyroid disease     Past Surgical History:  Procedure Laterality Date  . EYE SURGERY      Social History    Socioeconomic History  . Marital status: Married    Spouse name: Not on file  . Number of children: 0  . Years of education: 34  . Highest education level: Not on file  Occupational History  . Occupation: Nutritional therapist  Social Needs  . Financial resource strain: Not on file  . Food insecurity    Worry: Not on file    Inability: Not on file  . Transportation needs    Medical: Not on file    Non-medical: Not on file  Tobacco Use  . Smoking status: Never Smoker  . Smokeless tobacco: Never Used  Substance and Sexual Activity  . Alcohol use: Yes    Comment: once a month, beer  . Drug use: No  . Sexual activity: Not on file  Lifestyle  . Physical activity    Days per week: Not on file    Minutes per session: Not on file  . Stress: Not on file  Relationships  . Social Herbalist on phone: Not on file    Gets together: Not on file    Attends religious service: Not on file    Active member of club or organization: Not on file    Attends meetings of clubs or organizations: Not on file    Relationship status: Not on file  Other Topics Concern  . Not on file  Social History Narrative   Currently lives with his wife. Fun: Go to the beach   Denies religious beliefs effecting health care.     Family History  Problem Relation Age of Onset  . Healthy Mother   . Diabetes Maternal Grandmother   . Diabetes Paternal Grandmother     Health Maintenance  Topic Date Due  . PNEUMOCOCCAL POLYSACCHARIDE VACCINE AGE 29-64 HIGH RISK  03/18/1975  . HIV Screening  03/17/1988  . OPHTHALMOLOGY EXAM  07/22/2015  . FOOT EXAM  01/11/2016  . INFLUENZA VACCINE  04/05/2019  . HEMOGLOBIN A1C  11/06/2019  . URINE MICROALBUMIN  05/08/2020  . TETANUS/TDAP  10/05/2021    ----------------------------------------------------------------------------------------------------------------------------------------------------------------------------------------------------------------- Physical Exam  BP 116/70   Pulse 97   Temp 98.2 F (36.8 C) (Oral)   Ht 6' (1.829 m)   Wt 184 lb 9.6 oz (83.7 kg)   SpO2 96%   BMI 25.04 kg/m   Physical Exam Constitutional:      Appearance: Normal appearance.  HENT:     Head: Normocephalic and atraumatic.  Eyes:     General: No scleral icterus. Neck:     Musculoskeletal: Neck supple.  Cardiovascular:     Rate and Rhythm: Normal rate and regular rhythm.  Pulmonary:     Effort: Pulmonary effort is normal.     Breath sounds: Normal breath sounds.  Skin:    General: Skin is warm and dry.  Neurological:     General: No focal deficit present.     Mental Status: He is alert.  Psychiatric:        Mood and Affect: Mood normal.        Behavior: Behavior normal.     ------------------------------------------------------------------------------------------------------------------------------------------------------------------------------------------------------------------- Assessment and Plan  Hypothyroidism -Updated TSH -Restart levothyroxine.    Essential hypertension -BP well controlled at this time, continue to monitor.   Insulin dependent diabetes mellitus (HCC) -Poorly controlled.  He is symptomatic with increased thirst and urination.  -Rx for 70/30 insulin sent in, instructed to take as directed and check sugars twice daily.   -Recommend low carb diet.  Discussed how drinking Gatorade is likely worsening his blood sugars.  Recommend water or sugar free beverages-states he doesn't like these.  -Discussed importance of compliance to diet and using medication correctly -Reviewed consequences of poorly controlled diabetes including cardiovascular disease, renal disease, eye disease and neuropathy -Referral placed to endocrinology as well.   Dyslipidemia -It appears that he was previously taking lovastatin.  Update lipid panel today.

## 2019-05-12 NOTE — Assessment & Plan Note (Signed)
-  Updated TSH -Restart levothyroxine.

## 2019-05-13 NOTE — Progress Notes (Signed)
Critical glucose result from last week.  Please contact him to see if anyone got in touch with him over the weekend.  Also please ask if he has checked his glucose since restarting his insulin.  -His kidney function is abnormal as well.  Likely related to his uncontrolled diabetes.   -Cholesterol is high, recommend starting medication to lower this.  I would recommend atorvastatin 40mg  daily.

## 2019-05-19 ENCOUNTER — Telehealth: Payer: Self-pay

## 2019-05-19 NOTE — Telephone Encounter (Signed)
Dr. Zigmund Daniel please advise pt states their needing this to turn in Wednesday. Not sure how far a long this is done. Thanks!   Copied from [place] [id number]. Topic: General - Inquiry >> May 16, 2019  9:07 AM Virl Axe D wrote: Reason for CRM: Pt stated he left FMLA paperwork with Dr. Zigmund Daniel at his [place] on 05/09/19. He is calling to see if they are ready for pickup. Requesting CB. #[phone] >> May 19, 2019  9:15 AM Antonieta Iba C wrote: Pt called back in to follow up on FMLA paperwork. Pt says that he requested a call back on Friday. Pt says that he have to have paperwork turned in on Wednesday. Pt would lik to come to  Pick up.    Please assist, is paperwork complete and ready?

## 2019-05-19 NOTE — Telephone Encounter (Signed)
Paperwork available to pick up on 9/15.  How have his blood sugars been doing.  We have been trying to reach him regarding this?

## 2019-05-19 NOTE — Telephone Encounter (Signed)
Pt aware of the status of the paperwork, told pt would call again tomorrow to let him know when he can come pick up. Pt states that his sugars he feels are being more regulated since he has his insulin, he states he feels a lot better.

## 2019-06-04 ENCOUNTER — Telehealth: Payer: Self-pay

## 2019-06-04 NOTE — Telephone Encounter (Signed)
I am unfortunately unable to provide a note as I didn't see him during the time period of his illness.

## 2019-06-04 NOTE — Telephone Encounter (Signed)
Copied from [place] [id number]. Topic: General - Inquiry >> Jun 04, 2019 10:22 AM Christopher Horne, NT wrote: Reason for CRM: Patient called in stating he would like PCP to write a note for work stating he was sick for the past few days. Patient did not have appointment with practitioner in regards to this issue. Please advise. Call back [phone]. >> Jun 04, 2019 10:30 AM Scherrie Gerlach wrote: 9-21 to 9-29 he was out of work and wants note for these dates. Due to diabetic issues.

## 2019-06-04 NOTE — Telephone Encounter (Signed)
Spoke with pt and informed him of Dr. Zigmund Daniel message. Pt understood and had no additional questions at this time. Nothing further is needed

## 2019-06-30 ENCOUNTER — Telehealth: Payer: Self-pay | Admitting: Family Medicine

## 2019-06-30 NOTE — Telephone Encounter (Signed)
Pt called stating his job is closing down and he will no longer have insurance. Pt states he is being cut off from his insurance at the end of this month. Pt is requesting to get as much insulin as possible sent in for him so that he can get it under his insurance before it runs out. Pt states he is actively looking for a job and insurance. Pt was told it is possible this might not be able to be done. Please advise .   [id number] [place], [place]  [address] [place] [address]  Phone: [phone] Fax: [phone]  Open 24 hours

## 2019-07-01 ENCOUNTER — Other Ambulatory Visit: Payer: Self-pay | Admitting: Family Medicine

## 2019-07-01 MED ORDER — NOVOLOG MIX 70/30 FLEXPEN (70-30) 100 UNIT/ML ~~LOC~~ SUPN: 35.0000 [IU] | PEN_INJECTOR | Freq: Two times a day (BID) | SUBCUTANEOUS | 3 refills | Status: DC

## 2019-07-01 NOTE — Telephone Encounter (Signed)
Patient said that would be fine he's almost out. Thank You.

## 2019-07-01 NOTE — Telephone Encounter (Signed)
I can only send in a 90 days at a time, his current insurance will likely not pay for another 90 days until time for next refill.

## 2019-07-01 NOTE — Telephone Encounter (Signed)
Full VM 

## 2019-07-03 ENCOUNTER — Encounter (HOSPITAL_COMMUNITY): Payer: Self-pay | Admitting: Emergency Medicine

## 2019-07-03 ENCOUNTER — Other Ambulatory Visit: Payer: Self-pay

## 2019-07-03 ENCOUNTER — Inpatient Hospital Stay (HOSPITAL_COMMUNITY)
Admission: EM | Admit: 2019-07-03 | Discharge: 2019-07-05 | DRG: 638 | Disposition: A | Discharge status: Home / Self Care | Payer: Self-pay | Attending: Internal Medicine | Admitting: Internal Medicine

## 2019-07-03 DIAGNOSIS — N179 Acute kidney failure, unspecified: Secondary | ICD-10-CM

## 2019-07-03 DIAGNOSIS — E1010 Type 1 diabetes mellitus with ketoacidosis without coma: Secondary | ICD-10-CM

## 2019-07-03 DIAGNOSIS — N182 Chronic kidney disease, stage 2 (mild): Secondary | ICD-10-CM | POA: Diagnosis present

## 2019-07-03 DIAGNOSIS — K529 Noninfective gastroenteritis and colitis, unspecified: Secondary | ICD-10-CM | POA: Diagnosis present

## 2019-07-03 DIAGNOSIS — Z20828 Contact with and (suspected) exposure to other viral communicable diseases: Secondary | ICD-10-CM | POA: Diagnosis present

## 2019-07-03 DIAGNOSIS — Z833 Family history of diabetes mellitus: Secondary | ICD-10-CM

## 2019-07-03 DIAGNOSIS — R112 Nausea with vomiting, unspecified: Secondary | ICD-10-CM

## 2019-07-03 DIAGNOSIS — E860 Dehydration: Secondary | ICD-10-CM | POA: Diagnosis present

## 2019-07-03 DIAGNOSIS — T383X6A Underdosing of insulin and oral hypoglycemic [antidiabetic] drugs, initial encounter: Secondary | ICD-10-CM | POA: Diagnosis present

## 2019-07-03 DIAGNOSIS — E875 Hyperkalemia: Secondary | ICD-10-CM

## 2019-07-03 DIAGNOSIS — Z91120 Patient's intentional underdosing of medication regimen due to financial hardship: Secondary | ICD-10-CM

## 2019-07-03 DIAGNOSIS — I129 Hypertensive chronic kidney disease with stage 1 through stage 4 chronic kidney disease, or unspecified chronic kidney disease: Secondary | ICD-10-CM | POA: Diagnosis present

## 2019-07-03 DIAGNOSIS — E039 Hypothyroidism, unspecified: Secondary | ICD-10-CM | POA: Diagnosis present

## 2019-07-03 DIAGNOSIS — R197 Diarrhea, unspecified: Secondary | ICD-10-CM

## 2019-07-03 DIAGNOSIS — E1110 Type 2 diabetes mellitus with ketoacidosis without coma: Principal | ICD-10-CM

## 2019-07-03 DIAGNOSIS — I10 Essential (primary) hypertension: Secondary | ICD-10-CM | POA: Diagnosis present

## 2019-07-03 LAB — HEPATIC FUNCTION PANEL
ALT: 21 U/L (ref 0–44)
AST: 15 U/L (ref 15–41)
Albumin: 4 g/dL (ref 3.5–5.0)
Alkaline Phosphatase: 99 U/L (ref 38–126)
Bilirubin, Direct: <0.1 mg/dL (ref 0.0–0.2)
Total Bilirubin: 1.6 mg/dL — ABNORMAL HIGH (ref 0.3–1.2)
Total Protein: 7.9 g/dL (ref 6.5–8.1)

## 2019-07-03 LAB — CBC WITH DIFFERENTIAL/PLATELET
Abs Immature Granulocytes: 0.21 10*3/uL — ABNORMAL HIGH (ref 0.00–0.07)
Basophils Absolute: 0 10*3/uL (ref 0.0–0.1)
Basophils Relative: 0 %
Eosinophils Absolute: 0 10*3/uL (ref 0.0–0.5)
Eosinophils Relative: 0 %
HCT: 47.7 % (ref 39.0–52.0)
Hemoglobin: 14.6 g/dL (ref 13.0–17.0)
Immature Granulocytes: 1 %
Lymphocytes Relative: 4 %
Lymphs Abs: 0.7 10*3/uL (ref 0.7–4.0)
MCH: 26.7 pg (ref 26.0–34.0)
MCHC: 30.6 g/dL (ref 30.0–36.0)
MCV: 87.2 fL (ref 80.0–100.0)
Monocytes Absolute: 1.1 10*3/uL — ABNORMAL HIGH (ref 0.1–1.0)
Monocytes Relative: 6 %
Neutro Abs: 18 10*3/uL — ABNORMAL HIGH (ref 1.7–7.7)
Neutrophils Relative %: 89 %
Platelets: 455 10*3/uL — ABNORMAL HIGH (ref 150–400)
RBC: 5.47 MIL/uL (ref 4.22–5.81)
RDW: 13.3 % (ref 11.5–15.5)
WBC: 20.2 10*3/uL — ABNORMAL HIGH (ref 4.0–10.5)
nRBC: 0 % (ref 0.0–0.2)

## 2019-07-03 LAB — URINALYSIS, ROUTINE W REFLEX MICROSCOPIC
Bacteria, UA: NONE SEEN
Bilirubin Urine: NEGATIVE
Glucose, UA: >=500 mg/dL — AB
Hgb urine dipstick: NEGATIVE
Ketones, ur: 20 mg/dL — AB
Leukocytes,Ua: NEGATIVE
Nitrite: NEGATIVE
Protein, ur: NEGATIVE mg/dL
Specific Gravity, Urine: 1.021 (ref 1.005–1.030)
pH: 5 (ref 5.0–8.0)

## 2019-07-03 LAB — GLUCOSE, CAPILLARY
Glucose-Capillary: 268 mg/dL — ABNORMAL HIGH (ref 70–99)
Glucose-Capillary: 178 mg/dL — ABNORMAL HIGH (ref 70–99)
Glucose-Capillary: 202 mg/dL — ABNORMAL HIGH (ref 70–99)
Glucose-Capillary: 172 mg/dL — ABNORMAL HIGH (ref 70–99)
Glucose-Capillary: 137 mg/dL — ABNORMAL HIGH (ref 70–99)
Glucose-Capillary: 116 mg/dL — ABNORMAL HIGH (ref 70–99)
Glucose-Capillary: 116 mg/dL — ABNORMAL HIGH (ref 70–99)
Glucose-Capillary: 143 mg/dL — ABNORMAL HIGH (ref 70–99)
Glucose-Capillary: 222 mg/dL — ABNORMAL HIGH (ref 70–99)

## 2019-07-03 LAB — BASIC METABOLIC PANEL
Anion gap: 38 — ABNORMAL HIGH (ref 5–15)
Anion gap: 36 — ABNORMAL HIGH (ref 5–15)
Anion gap: 14 (ref 5–15)
BUN: 57 mg/dL — ABNORMAL HIGH (ref 6–20)
BUN: 55 mg/dL — ABNORMAL HIGH (ref 6–20)
BUN: 44 mg/dL — ABNORMAL HIGH (ref 6–20)
CO2: 10 mmol/L — ABNORMAL LOW (ref 22–32)
CO2: 8 mmol/L — ABNORMAL LOW (ref 22–32)
CO2: 29 mmol/L (ref 22–32)
Calcium: 8.7 mg/dL — ABNORMAL LOW (ref 8.9–10.3)
Calcium: 8.4 mg/dL — ABNORMAL LOW (ref 8.9–10.3)
Calcium: 9 mg/dL (ref 8.9–10.3)
Chloride: 70 mmol/L — ABNORMAL LOW (ref 98–111)
Chloride: 80 mmol/L — ABNORMAL LOW (ref 98–111)
Chloride: 98 mmol/L (ref 98–111)
Creatinine, Ser: 3.1 mg/dL — ABNORMAL HIGH (ref 0.61–1.24)
Creatinine, Ser: 2.85 mg/dL — ABNORMAL HIGH (ref 0.61–1.24)
Creatinine, Ser: 2 mg/dL — ABNORMAL HIGH (ref 0.61–1.24)
GFR calc Af Amer: 27 mL/min — ABNORMAL LOW (ref >60)
GFR calc Af Amer: 29 mL/min — ABNORMAL LOW (ref >60)
GFR calc Af Amer: 45 mL/min — ABNORMAL LOW (ref >60)
GFR calc non Af Amer: 23 mL/min — ABNORMAL LOW (ref >60)
GFR calc non Af Amer: 25 mL/min — ABNORMAL LOW (ref >60)
GFR calc non Af Amer: 39 mL/min — ABNORMAL LOW (ref >60)
Glucose, Bld: 1310 mg/dL (ref 70–99)
Glucose, Bld: 1091 mg/dL (ref 70–99)
Glucose, Bld: 240 mg/dL — ABNORMAL HIGH (ref 70–99)
Potassium: 6.7 mmol/L (ref 3.5–5.1)
Potassium: 5.3 mmol/L — ABNORMAL HIGH (ref 3.5–5.1)
Potassium: 3.5 mmol/L (ref 3.5–5.1)
Sodium: 118 mmol/L — CL (ref 135–145)
Sodium: 124 mmol/L — ABNORMAL LOW (ref 135–145)
Sodium: 141 mmol/L (ref 135–145)

## 2019-07-03 LAB — I-STAT CHEM 8, ED
BUN: 60 mg/dL — ABNORMAL HIGH (ref 6–20)
Calcium, Ion: 0.8 mmol/L — CL (ref 1.15–1.40)
Chloride: 86 mmol/L — ABNORMAL LOW (ref 98–111)
Creatinine, Ser: 2.4 mg/dL — ABNORMAL HIGH (ref 0.61–1.24)
Glucose, Bld: >700 mg/dL (ref 70–99)
HCT: 52 % (ref 39.0–52.0)
Hemoglobin: 17.7 g/dL — ABNORMAL HIGH (ref 13.0–17.0)
Potassium: 6.1 mmol/L — ABNORMAL HIGH (ref 3.5–5.1)
Sodium: 115 mmol/L — CL (ref 135–145)
TCO2: 11 mmol/L — ABNORMAL LOW (ref 22–32)

## 2019-07-03 LAB — LIPASE, BLOOD: Lipase: 18 U/L (ref 11–51)

## 2019-07-03 LAB — SARS CORONAVIRUS 2 BY RT PCR (HOSPITAL ORDER, PERFORMED IN ~~LOC~~ HOSPITAL LAB): SARS Coronavirus 2: NEGATIVE

## 2019-07-03 LAB — BLOOD GAS, VENOUS
Acid-base deficit: 18.6 mmol/L — ABNORMAL HIGH (ref 0.0–2.0)
Bicarbonate: 11.9 mmol/L — ABNORMAL LOW (ref 20.0–28.0)
O2 Saturation: 71.2 %
O2 Saturation: 90.8 %
Patient temperature: 98.6
Patient temperature: 98.6
pCO2, Ven: 43.8 mmHg — ABNORMAL LOW (ref 44.0–60.0)
pCO2, Ven: <19 mmHg — CL (ref 44.0–60.0)
pH, Ven: 7.061 — CL (ref 7.250–7.430)
pH, Ven: 7.19 — CL (ref 7.250–7.430)
pO2, Ven: 48.8 mmHg — ABNORMAL HIGH (ref 32.0–45.0)
pO2, Ven: 69.4 mmHg — ABNORMAL HIGH (ref 32.0–45.0)

## 2019-07-03 LAB — CBG MONITORING, ED
Glucose-Capillary: >600 mg/dL (ref 70–99)
Glucose-Capillary: >600 mg/dL (ref 70–99)
Glucose-Capillary: >600 mg/dL (ref 70–99)
Glucose-Capillary: >600 mg/dL (ref 70–99)
Glucose-Capillary: >600 mg/dL (ref 70–99)
Glucose-Capillary: 496 mg/dL — ABNORMAL HIGH (ref 70–99)
Glucose-Capillary: 353 mg/dL — ABNORMAL HIGH (ref 70–99)

## 2019-07-03 LAB — HIV ANTIBODY (ROUTINE TESTING W REFLEX): HIV Screen 4th Generation wRfx: NONREACTIVE

## 2019-07-03 LAB — MRSA PCR SCREENING: MRSA by PCR: NEGATIVE

## 2019-07-03 LAB — BETA-HYDROXYBUTYRIC ACID: Beta-Hydroxybutyric Acid: >8 mmol/L — ABNORMAL HIGH (ref 0.05–0.27)

## 2019-07-03 MED ORDER — CALCIUM GLUCONATE-NACL 1-0.675 GM/50ML-% IV SOLN
1.0000 g | Freq: Once | INTRAVENOUS | Status: AC
  Administered 2019-07-03: 1000 mg via INTRAVENOUS
  Filled 2019-07-03: qty 50

## 2019-07-03 MED ORDER — LACTATED RINGERS IV BOLUS
2000.0000 mL | Freq: Once | INTRAVENOUS | Status: AC
  Administered 2019-07-03: 1000 mL via INTRAVENOUS

## 2019-07-03 MED ORDER — ACETAMINOPHEN 325 MG PO TABS: 650.0000 mg | ORAL_TABLET | Freq: Four times a day (QID) | ORAL | Status: DC | PRN

## 2019-07-03 MED ORDER — INSULIN GLARGINE 100 UNIT/ML ~~LOC~~ SOLN
10.0000 [IU] | SUBCUTANEOUS | Status: DC
  Administered 2019-07-03: 10 [IU] via SUBCUTANEOUS
  Filled 2019-07-03: qty 0.1

## 2019-07-03 MED ORDER — SODIUM CHLORIDE 0.9 % IV BOLUS: 1000.0000 mL | Freq: Once | INTRAVENOUS | Status: DC

## 2019-07-03 MED ORDER — INSULIN ASPART 100 UNIT/ML ~~LOC~~ SOLN
0.0000 [IU] | Freq: Every day | SUBCUTANEOUS | Status: DC
  Administered 2019-07-03 – 2019-07-04 (×2): 2 [IU] via SUBCUTANEOUS

## 2019-07-03 MED ORDER — DEXTROSE-NACL 5-0.45 % IV SOLN
INTRAVENOUS | Status: DC
  Administered 2019-07-03: 17:00:00 via INTRAVENOUS

## 2019-07-03 MED ORDER — LEVOTHYROXINE SODIUM 25 MCG PO TABS
25.0000 ug | ORAL_TABLET | Freq: Every day | ORAL | Status: DC
  Administered 2019-07-04 – 2019-07-05 (×2): 25 ug via ORAL
  Filled 2019-07-03 (×2): qty 1

## 2019-07-03 MED ORDER — CALCIUM GLUCONATE 10 % IV SOLN: 1.0000 g | Freq: Once | INTRAVENOUS | Status: DC

## 2019-07-03 MED ORDER — SODIUM CHLORIDE 0.9 % IV SOLN: INTRAVENOUS | Status: DC

## 2019-07-03 MED ORDER — INSULIN REGULAR(HUMAN) IN NACL 100-0.9 UT/100ML-% IV SOLN
INTRAVENOUS | Status: DC
  Administered 2019-07-03: 5.4 [IU]/h via INTRAVENOUS
  Filled 2019-07-03 (×2): qty 100

## 2019-07-03 MED ORDER — SODIUM CHLORIDE 0.9 % IV BOLUS
1000.0000 mL | Freq: Once | INTRAVENOUS | Status: AC
  Administered 2019-07-03: 1000 mL via INTRAVENOUS

## 2019-07-03 MED ORDER — SODIUM BICARBONATE 8.4 % IV SOLN
50.0000 meq | Freq: Once | INTRAVENOUS | Status: AC
  Administered 2019-07-03: 50 meq via INTRAVENOUS
  Filled 2019-07-03: qty 50

## 2019-07-03 MED ORDER — ONDANSETRON HCL 4 MG/2ML IJ SOLN
4.0000 mg | Freq: Once | INTRAMUSCULAR | Status: AC
  Administered 2019-07-03: 4 mg via INTRAVENOUS
  Filled 2019-07-03: qty 2

## 2019-07-03 MED ORDER — INSULIN ASPART 100 UNIT/ML ~~LOC~~ SOLN
10.0000 [IU] | Freq: Once | SUBCUTANEOUS | Status: AC
  Administered 2019-07-03: 10 [IU] via INTRAVENOUS
  Filled 2019-07-03: qty 0.1

## 2019-07-03 MED ORDER — ONDANSETRON HCL 4 MG/2ML IJ SOLN
4.0000 mg | Freq: Four times a day (QID) | INTRAMUSCULAR | Status: DC | PRN
  Administered 2019-07-03: 4 mg via INTRAVENOUS
  Filled 2019-07-03: qty 2

## 2019-07-03 MED ORDER — ALBUTEROL SULFATE (2.5 MG/3ML) 0.083% IN NEBU
5.0000 mg | INHALATION_SOLUTION | Freq: Once | RESPIRATORY_TRACT | Status: AC
  Administered 2019-07-03: 5 mg via RESPIRATORY_TRACT
  Filled 2019-07-03: qty 6

## 2019-07-03 MED ORDER — CHLORHEXIDINE GLUCONATE CLOTH 2 % EX PADS
6.0000 | MEDICATED_PAD | Freq: Every day | CUTANEOUS | Status: DC
  Administered 2019-07-03 – 2019-07-04 (×2): 6 via TOPICAL

## 2019-07-03 MED ORDER — INSULIN REGULAR(HUMAN) IN NACL 100-0.9 UT/100ML-% IV SOLN
INTRAVENOUS | Status: DC
  Administered 2019-07-03: 6.7 [IU]/h via INTRAVENOUS
  Administered 2019-07-03: 10.4 [IU]/h via INTRAVENOUS
  Filled 2019-07-03: qty 100

## 2019-07-03 MED ORDER — ENOXAPARIN SODIUM 40 MG/0.4ML ~~LOC~~ SOLN
40.0000 mg | SUBCUTANEOUS | Status: DC
  Administered 2019-07-03 – 2019-07-04 (×2): 40 mg via SUBCUTANEOUS
  Filled 2019-07-03 (×3): qty 0.4

## 2019-07-03 MED ORDER — INSULIN ASPART 100 UNIT/ML ~~LOC~~ SOLN
0.0000 [IU] | Freq: Three times a day (TID) | SUBCUTANEOUS | Status: DC
  Administered 2019-07-04 (×2): 7 [IU] via SUBCUTANEOUS
  Administered 2019-07-04 – 2019-07-05 (×2): 3 [IU] via SUBCUTANEOUS

## 2019-07-03 NOTE — ED Notes (Signed)
Three attempts at IV start unsuccessful labs obtained but unable to successfully cannulate IV start. IV team consult placed and IV team paged d/t pt CBG > 600

## 2019-07-03 NOTE — ED Triage Notes (Signed)
Been vomiting for several days and not able to tolerate any PO fluids. Is type 1 diabetic.

## 2019-07-03 NOTE — H&P (Addendum)
TRH H&P    Patient Demographics:    Cylis Ayars, is a 48 y.o. male  MRN: [id number]  DOB - 02/17/1971  Admit Date - 07/03/2019  Referring MD/NP/PA: Dr. Leonides Schanz  Outpatient Primary MD for the patient is Luetta Nutting, DO  Patient coming from: Home  Chief complaint-vomiting   HPI:    Javad Salva  is a 48 y.o. male, with history of diabetes mellitus type 2, hypothyroidism, hypertension who presented to the ED with complaints of nausea vomiting and diarrhea for past 3 days.  Patient says that he has been vomiting for past 3 days nonstop and has not been able to keep anything down.  He also stopped taking his insulin.  In the ED he was found to be in diabetic ketoacidosis with anion gap of 38.  Started on IV insulin.   He denies chest pain or shortness of breath Denies fever, abdominal pain or dysuria Denies previous history of stroke or seizures Denies blurred vision or headache     Review of systems:    In addition to the HPI above,    All other systems reviewed and are negative.    Past History of the following :    Past Medical History:  Diagnosis Date   Diabetes mellitus without complication ([place])    Hyperlipidemia    Thyroid disease       Past Surgical History:  Procedure Laterality Date   EYE SURGERY        Social History:      Social History   Tobacco Use   Smoking status: Never Smoker   Smokeless tobacco: Never Used  Substance Use Topics   Alcohol use: Yes    Comment: once a month, beer       Family History :     Family History  Problem Relation Age of Onset   Healthy Mother    Diabetes Maternal Grandmother    Diabetes Paternal Grandmother       Home Medications:   Prior to Admission medications   Medication Sig Start Date End Date Taking? Authorizing Provider  acetaminophen (TYLENOL) 325 MG tablet Take 650 mg by mouth every 6 (six) hours  as needed for mild pain or headache.   Yes [provider]  enalapril (VASOTEC) 5 MG tablet Take 5 mg by mouth daily. 03/21/18  Yes [provider]  insulin aspart protamine - aspart (NOVOLOG MIX 70/30 FLEXPEN) (70-30) 100 UNIT/ML FlexPen Inject 0.35 mLs (35 Units total) into the skin 2 (two) times daily with a meal. 07/01/19 07/31/19 Yes Luetta Nutting, DO  levothyroxine (SYNTHROID) 25 MCG tablet Take 1 tablet (25 mcg total) by mouth daily before breakfast. 05/09/19  Yes Luetta Nutting, DO  blood glucose meter kit and supplies KIT Dispense based on patient and insurance preference. Use up to four times daily as directed. 09/06/17   Binnie Rail, MD  glucose blood (ONETOUCH VERIO) test strip Use as instructed to check blood sugar 3 times per day dx code 02/26/15   Renato Shin, MD  Insulin Pen  Needle (B-D UF III MINI PEN NEEDLES) 31G X 5 MM MISC Use to inject insulin 2/day. 10/05/14   Renato Shin, MD  [facility] DELICA LANCETS FINE MISC Use to check blood sugar 3 times per day dx code E11.9 02/26/15   Renato Shin, MD     Allergies:    No Known Allergies   Physical Exam:   Vitals  Blood pressure (!) 106/59, pulse (!) 124, temperature (!) 96.4 F (35.8 C), temperature source Rectal, resp. rate 18, height 6' (1.829 m), weight 90.7 kg, SpO2 98 %.  1.  General: Appears in no acute distress  2. Psychiatric: Alert, oriented x3, intact insight and judgment  3. Neurologic: Cranial nerves II through XII grossly intact, no focal deficit noted  4. HEENMT:  Atraumatic normocephalic, extraocular muscles are intact  5. Respiratory : Clear to auscultation bilaterally  6. Cardiovascular : S1-S2, regular, no murmur auscultated  7. Gastrointestinal:  Abdomen is soft, nontender, no organomegaly      Data Review:    CBC Recent Labs  Lab 07/03/19 0545 07/03/19 0549  WBC  --  20.2*  HGB 17.7* 14.6  HCT 52.0 47.7  PLT  --  455*  MCV  --  87.2  MCH  --  26.7  MCHC  --   30.6  RDW  --  13.3  LYMPHSABS  --  0.7  MONOABS  --  1.1*  EOSABS  --  0.0  BASOSABS  --  0.0   ------------------------------------------------------------------------------------------------------------------  Results for orders placed or performed during the hospital encounter of 07/03/19 (from the past 48 hour(s))  CBG monitoring, ED     Status: Abnormal   Collection Time: 07/03/19  5:25 AM  Result Value Ref Range   Glucose-Capillary >600 (HH) 70 - 99 mg/dL   Comment 1 Notify RN   Blood gas, venous     Status: Abnormal   Collection Time: 07/03/19  5:32 AM  Result Value Ref Range   pH, Ven 7.061 (LL) 7.250 - 7.430    Comment: CRITICAL RESULT CALLED TO, READ BACK BY AND VERIFIED WITH: BROOKS RN AT [phone] ON 07/03/2019 BY S.VANHOORNE    pCO2, Ven 43.8 (L) 44.0 - 60.0 mmHg   pO2, Ven 48.8 (H) 32.0 - 45.0 mmHg   Bicarbonate 11.9 (L) 20.0 - 28.0 mmol/L   Acid-base deficit 18.6 (H) 0.0 - 2.0 mmol/L   O2 Saturation 71.2 %   Patient temperature 98.6    Sample type VENOUS     Comment: Performed at [facility], [place] [address] [place], [place] [address]  I-stat chem 8, ED     Status: Abnormal   Collection Time: 07/03/19  5:45 AM  Result Value Ref Range   Sodium 115 (LL) 135 - 145 mmol/L   Potassium 6.1 (H) 3.5 - 5.1 mmol/L   Chloride 86 (L) 98 - 111 mmol/L   BUN 60 (H) 6 - 20 mg/dL   Creatinine, Ser 2.40 (H) 0.61 - 1.24 mg/dL   Glucose, Bld >700 (HH) 70 - 99 mg/dL   Calcium, Ion 0.80 (LL) 1.15 - 1.40 mmol/L   TCO2 11 (L) 22 - 32 mmol/L   Hemoglobin 17.7 (H) 13.0 - 17.0 g/dL   HCT 52.0 39.0 - 52.0 %   Comment NOTIFIED PHYSICIAN   CBC with Differential (PNL)     Status: Abnormal   Collection Time: 07/03/19  5:49 AM  Result Value Ref Range   WBC 20.2 (H) 4.0 - 10.5 K/uL   RBC  5.47 4.22 - 5.81 MIL/uL   Hemoglobin 14.6 13.0 - 17.0 g/dL   HCT 47.7 39.0 - 52.0 %   MCV 87.2 80.0 - 100.0 fL   MCH 26.7 26.0 - 34.0 pg   MCHC 30.6 30.0 - 36.0 g/dL   RDW 13.3 11.5  - 15.5 %   Platelets 455 (H) 150 - 400 K/uL   nRBC 0.0 0.0 - 0.2 %   Neutrophils Relative % 89 %   Neutro Abs 18.0 (H) 1.7 - 7.7 K/uL   Lymphocytes Relative 4 %   Lymphs Abs 0.7 0.7 - 4.0 K/uL   Monocytes Relative 6 %   Monocytes Absolute 1.1 (H) 0.1 - 1.0 K/uL   Eosinophils Relative 0 %   Eosinophils Absolute 0.0 0.0 - 0.5 K/uL   Basophils Relative 0 %   Basophils Absolute 0.0 0.0 - 0.1 K/uL   Immature Granulocytes 1 %   Abs Immature Granulocytes 0.21 (H) 0.00 - 0.07 K/uL    Comment: Performed at [facility], [place] [address] [place], [place] [address]  Beta-hydroxybutyric acid     Status: Abnormal   Collection Time: 07/03/19  5:49 AM  Result Value Ref Range   Beta-Hydroxybutyric Acid >8.00 (H) 0.05 - 0.27 mmol/L    Comment: RESULTS CONFIRMED BY MANUAL DILUTION Performed at [facility] [address] [place], [place] [address]   Basic metabolic panel     Status: Abnormal   Collection Time: 07/03/19  5:49 AM  Result Value Ref Range   Sodium 118 (LL) 135 - 145 mmol/L    Comment: REPEATED TO VERIFY CRITICAL RESULT CALLED TO, READ BACK BY AND VERIFIED WITH: WARD,K. MD AT 5277 07/03/19 MULLINS,T    Potassium 6.7 (HH) 3.5 - 5.1 mmol/L    Comment: NO VISIBLE HEMOLYSIS REPEATED TO VERIFY CRITICAL RESULT CALLED TO, READ BACK BY AND VERIFIED WITH: WARD,K. MD AT 8242 07/03/19 MULLINS,T    Chloride 70 (L) 98 - 111 mmol/L   CO2 10 (L) 22 - 32 mmol/L   Glucose, Bld 1,310 (HH) 70 - 99 mg/dL    Comment: RESULTS CONFIRMED BY MANUAL DILUTION CRITICAL RESULT CALLED TO, READ BACK BY AND VERIFIED WITH: ZULETA,M. RN AT 3536 07/03/19 MULLINS,T    BUN 57 (H) 6 - 20 mg/dL   Creatinine, Ser 3.10 (H) 0.61 - 1.24 mg/dL   Calcium 8.7 (L) 8.9 - 10.3 mg/dL   GFR calc non Af Amer 23 (L) >60 mL/min   GFR calc Af Amer 27 (L) >60 mL/min   Anion gap 38 (H) 5 - 15    Comment: Performed at [facility], [place] [address], [place], [place]  [address]  Hepatic function panel     Status: Abnormal   Collection Time: 07/03/19  5:49 AM  Result Value Ref Range   Total Protein 7.9 6.5 - 8.1 g/dL   Albumin 4.0 3.5 - 5.0 g/dL   AST 15 15 - 41 U/L   ALT 21 0 - 44 U/L   Alkaline Phosphatase 99 38 - 126 U/L   Total Bilirubin 1.6 (H) 0.3 - 1.2 mg/dL   Bilirubin, Direct <0.1 0.0 - 0.2 mg/dL   Indirect Bilirubin NOT CALCULATED 0.3 - 0.9 mg/dL    Comment: Performed at [facility], [place] [address] [place], [place] [address]  Lipase, blood     Status: None   Collection Time: 07/03/19  5:49 AM  Result Value Ref Range   Lipase 18 11 - 51  U/L    Comment: Performed at [facility], [place] [address], [place], [place] [address]  Urinalysis, Routine w reflex microscopic     Status: Abnormal   Collection Time: 07/03/19  6:22 AM  Result Value Ref Range   Color, Urine STRAW (A) YELLOW   APPearance CLEAR CLEAR   Specific Gravity, Urine 1.021 1.005 - 1.030   pH 5.0 5.0 - 8.0   Glucose, UA >=500 (A) NEGATIVE mg/dL   Hgb urine dipstick NEGATIVE NEGATIVE   Bilirubin Urine NEGATIVE NEGATIVE   Ketones, ur 20 (A) NEGATIVE mg/dL   Protein, ur NEGATIVE NEGATIVE mg/dL   Nitrite NEGATIVE NEGATIVE   Leukocytes,Ua NEGATIVE NEGATIVE   RBC / HPF 0-5 0 - 5 RBC/hpf   WBC, UA 6-10 0 - 5 WBC/hpf   Bacteria, UA NONE SEEN NONE SEEN   Squamous Epithelial / LPF 0-5 0 - 5   Mucus PRESENT     Comment: Performed at [facility], [place] [address] [place], [place] [address]  SARS Coronavirus 2 by RT PCR (hospital order, performed in [facility] hospital lab) Nasopharyngeal Nasopharyngeal Swab     Status: None   Collection Time: 07/03/19  6:49 AM   Specimen: Nasopharyngeal Swab  Result Value Ref Range   SARS Coronavirus 2 NEGATIVE NEGATIVE    Comment: (NOTE) If result is NEGATIVE SARS-CoV-2 target nucleic acids are NOT DETECTED. The SARS-CoV-2 RNA is generally detectable in upper and lower  respiratory specimens  during the acute phase of infection. The lowest  concentration of SARS-CoV-2 viral copies this assay can detect is 250  copies / mL. A negative result does not preclude SARS-CoV-2 infection  and should not be used as the sole basis for treatment or other  patient management decisions.  A negative result may occur with  improper specimen collection / handling, submission of specimen other  than nasopharyngeal swab, presence of viral mutation(s) within the  areas targeted by this assay, and inadequate number of viral copies  (<250 copies / mL). A negative result must be combined with clinical  observations, patient history, and epidemiological information. If result is POSITIVE SARS-CoV-2 target nucleic acids are DETECTED. The SARS-CoV-2 RNA is generally detectable in upper and lower  respiratory specimens dur ing the acute phase of infection.  Positive  results are indicative of active infection with SARS-CoV-2.  Clinical  correlation with patient history and other diagnostic information is  necessary to determine patient infection status.  Positive results do  not rule out bacterial infection or co-infection with other viruses. If result is PRESUMPTIVE POSTIVE SARS-CoV-2 nucleic acids MAY BE PRESENT.   A presumptive positive result was obtained on the submitted specimen  and confirmed on repeat testing.  While 2019 novel coronavirus  (SARS-CoV-2) nucleic acids may be present in the submitted sample  additional confirmatory testing may be necessary for epidemiological  and / or clinical management purposes  to differentiate between  SARS-CoV-2 and other Sarbecovirus currently known to infect humans.  If clinically indicated additional testing with an alternate test  methodology [phone]) is advised. The SARS-CoV-2 RNA is generally  detectable in upper and lower respiratory sp ecimens during the acute  phase of infection. The expected result is Negative. Fact Sheet for Patients:   StrictlyIdeas.no Fact Sheet for Healthcare Providers: BankingDealers.co.za This test is not yet approved or cleared by the [place] FDA and has been authorized for detection and/or diagnosis of SARS-CoV-2 by FDA under an Emergency Use Authorization (EUA).  This EUA will remain in effect (meaning this test can be used) for the duration of the COVID-19 declaration under Section 564(b)(1) of the Act, 21 U.S.C. section 360bbb-3(b)(1), unless the authorization is terminated or revoked sooner. Performed at [facility], [place] [address], [place], [place] [address]   Basic metabolic panel     Status: Abnormal   Collection Time: 07/03/19  7:12 AM  Result Value Ref Range   Sodium 124 (L) 135 - 145 mmol/L    Comment: DELTA CHECK NOTED QUANTITY NOT SUFFICIENT TO REPEAT TEST    Potassium 5.3 (H) 3.5 - 5.1 mmol/L    Comment: DELTA CHECK NOTED QUANTITY NOT SUFFICIENT TO REPEAT TEST NO VISIBLE HEMOLYSIS    Chloride 80 (L) 98 - 111 mmol/L   CO2 8 (L) 22 - 32 mmol/L   Glucose, Bld 1,091 (HH) 70 - 99 mg/dL    Comment: CRITICAL RESULT CALLED TO, READ BACK BY AND VERIFIED WITH: MOORE,C. RN AT 1275 07/03/19 MULLINS,T    BUN 55 (H) 6 - 20 mg/dL   Creatinine, Ser 2.85 (H) 0.61 - 1.24 mg/dL   Calcium 8.4 (L) 8.9 - 10.3 mg/dL   GFR calc non Af Amer 25 (L) >60 mL/min   GFR calc Af Amer 29 (L) >60 mL/min   Anion gap 36 (H) 5 - 15    Comment: Performed at [facility], [place] [address], [place], [place] [address]  CBG monitoring, ED     Status: Abnormal   Collection Time: 07/03/19  7:56 AM  Result Value Ref Range   Glucose-Capillary >600 (HH) 70 - 99 mg/dL    Chemistries  Recent Labs  Lab 07/03/19 0545 07/03/19 0549 07/03/19 0712  NA 115* 118* 124*  K 6.1* 6.7* 5.3*  CL 86* 70* 80*  CO2  --  10* 8*  GLUCOSE >700* 1,310* 1,091*  BUN 60* 57* 55*  CREATININE 2.40* 3.10* 2.85*  CALCIUM  --  8.7* 8.4*  AST   --  15  --   ALT  --  21  --   ALKPHOS  --  99  --   BILITOT  --  1.6*  --    ------------------------------------------------------------------------------------------------------------------  ------------------------------------------------------------------------------------------------------------------ GFR: Estimated Creatinine Clearance: 35.5 mL/min (A) (by C-G formula based on SCr of 2.85 mg/dL (H)). Liver Function Tests: Recent Labs  Lab 07/03/19 0549  AST 15  ALT 21  ALKPHOS 99  BILITOT 1.6*  PROT 7.9  ALBUMIN 4.0   Recent Labs  Lab 07/03/19 0549  LIPASE 18   No results for input(s): AMMONIA in the last 168 hours. Coagulation Profile: No results for input(s): INR, PROTIME in the last 168 hours. Cardiac Enzymes: No results for input(s): CKTOTAL, CKMB, CKMBINDEX, TROPONINI in the last 168 hours. BNP (last 3 results) No results for input(s): PROBNP in the last 8760 hours. HbA1C: No results for input(s): HGBA1C in the last 72 hours. CBG: Recent Labs  Lab 07/03/19 0525 07/03/19 0756  GLUCAP >600* >600*    --------------------------------------------------------------------------------------------------------------- Urine analysis:    Component Value Date/Time   COLORURINE STRAW (A) 07/03/2019 0622   APPEARANCEUR CLEAR 07/03/2019 0622   LABSPEC 1.021 07/03/2019 0622   PHURINE 5.0 07/03/2019 0622   GLUCOSEU >=500 (A) 07/03/2019 0622   HGBUR NEGATIVE 07/03/2019 0622   BILIRUBINUR NEGATIVE 07/03/2019 0622   BILIRUBINUR neg 05/09/2019 1618   KETONESUR 20 (A) 07/03/2019 0622   PROTEINUR NEGATIVE 07/03/2019 0622   UROBILINOGEN 0.2 05/09/2019 1618   UROBILINOGEN 1.0 01/20/2014 1923  NITRITE NEGATIVE 07/03/2019 0622   LEUKOCYTESUR NEGATIVE 07/03/2019 0622      Imaging Results:    No results found.  My personal review of EKG: Rhythm NSR, tall peaked T waves   Assessment & Plan:    Active Problems:   DKA (diabetic ketoacidoses) ([place])   1. Diabetic  ketoacidosis-patient presenting with diabetic ketoacidosis, will start on IV insulin, check BMP every 4 hours, check CBG every hour protocol.  Anion gap is 36 at this time.  Initial VBG showed pH of 7.061, he did receive 1 ampoule of sodium bicarb 50 mEq.  2. Hyperkalemia-patient potassium was 6.7, received calcium gluconate and sodium bicarb.  Repeat potassium at this time is 5.3.  Follow BMP every 4 hours per protocol as above.  3. Nausea vomiting and diarrhea-likely gastroenteritis, started on IV normal saline as above.  Zofran as needed for nausea and vomiting.  4. Leukocytosis-likely reactive from DKA, dehydration.  He is afebrile.  Follow WBC in a.m.  5. Pseudohyponatremia-patient initial sodium was 118, his blood glucose was 1310.  Blood glucose has come down to 1091 and sodium has improved to 124.  Continue to monitor sodium every 4 hours.  6. Acute kidney injury on CKD stage III-patient baseline creatinine is around 1.5, today presenting with creatinine of 3.  Secondary to poor p.o. intake, vomiting and diarrhea.  Started on IV hydration with normal saline as above.  Renal function slowly improving.  Will follow BMP.  7. Hypothyroidism-continue Synthroid  8. Hypertension- BP is stable, hold Enalapril    DVT Prophylaxis-   Lovenox   AM Labs Ordered, also please review Full Orders  Family Communication: Admission, patients condition and plan of care including tests being ordered have been discussed with the patient  who indicate understanding and agree with the plan and Code Status.  Code Status: Full code  Admission status: Inpatient: Based on patients clinical presentation and evaluation of above clinical data, I have made determination that patient meets Inpatient criteria at this time.  Time spent in minutes : 60 minutes   Oswald Hillock M.D on 07/03/2019 at 9:04 AM

## 2019-07-03 NOTE — ED Notes (Addendum)
Notified EDP,Ward,MD. Pt. I-stat Chem 8 results sodium 115 and glucose greater than 700 and potassium 7.1.

## 2019-07-03 NOTE — ED Notes (Signed)
RN asked patient if there was someone to call to let them know he was being admitted to the hospital. Status of marriage listed as legally separated. Patient did not request anyone be called at this time.

## 2019-07-03 NOTE — ED Provider Notes (Signed)
TIME SEEN: 5:38 AM  CHIEF COMPLAINT: Nausea, vomiting and diarrhea  HPI: Patient is a 48 year old insulin-dependent type 2 diabetic who presents to the emergency department nausea, vomiting diarrhea that started 3 days ago.  States he stopped taking his insulin because he was concerned because he was not eating that it would drop his blood sugar.  He has never had DKA before.  Denies fevers, chills, cough, congestion, loss of taste or smell, abdominal pain.  Blood sugar here greater than 600.  He is supposed to take NovoLog 70/30 32 units twice daily.  ROS: See HPI Constitutional: no fever  Eyes: no drainage  ENT: no runny nose   Cardiovascular:  no chest pain  Resp: no SOB  GI: Vomiting and diarrhea GU: no dysuria Integumentary: no rash  Allergy: no hives  Musculoskeletal: no leg swelling  Neurological: no slurred speech ROS otherwise negative  PAST MEDICAL HISTORY/PAST SURGICAL HISTORY:  Past Medical History:  Diagnosis Date  . Diabetes mellitus without complication ([place])   . Hyperlipidemia   . Thyroid disease     MEDICATIONS:  Prior to Admission medications   Medication Sig Start Date End Date Taking? Authorizing Provider  blood glucose meter kit and supplies KIT Dispense based on patient and insurance preference. Use up to four times daily as directed. 09/06/17   Binnie Rail, MD  glucose blood (ONETOUCH VERIO) test strip Use as instructed to check blood sugar 3 times per day dx code 02/26/15   Renato Shin, MD  insulin aspart protamine - aspart (NOVOLOG MIX 70/30 FLEXPEN) (70-30) 100 UNIT/ML FlexPen Inject 0.35 mLs (35 Units total) into the skin 2 (two) times daily with a meal. 07/01/19 07/31/19  Luetta Nutting, DO  Insulin Pen Needle (B-D UF III MINI PEN NEEDLES) 31G X 5 MM MISC Use to inject insulin 2/day. 10/05/14   Renato Shin, MD  levothyroxine (SYNTHROID) 25 MCG tablet Take 1 tablet (25 mcg total) by mouth daily before breakfast. 05/09/19   Luetta Nutting, DO  [facility]  DELICA LANCETS FINE MISC Use to check blood sugar 3 times per day dx code E11.9 02/26/15   Renato Shin, MD    ALLERGIES:  No Known Allergies  SOCIAL HISTORY:  Social History   Tobacco Use  . Smoking status: Never Smoker  . Smokeless tobacco: Never Used  Substance Use Topics  . Alcohol use: Yes    Comment: once a month, beer    FAMILY HISTORY: Family History  Problem Relation Age of Onset  . Healthy Mother   . Diabetes Maternal Grandmother   . Diabetes Paternal Grandmother     EXAM: BP 108/72   Pulse (!) 108   Temp 97.9 F (36.6 C) (Oral)   Resp 16   SpO2 100%  CONSTITUTIONAL: Alert and oriented x 3 and responds appropriately to questions.  In no distress. HEAD: Normocephalic EYES: Conjunctivae clear, pupils appear equal, EOMI ENT: normal nose; slightly dry mucous membranes NECK: Supple, no meningismus, no nuchal rigidity, no LAD  CARD: Regular and minimally tachycardic; S1 and S2 appreciated; no murmurs, no clicks, no rubs, no gallops RESP: Normal chest excursion without splinting or tachypnea; breath sounds clear and equal bilaterally; no wheezes, no rhonchi, no rales, no hypoxia or respiratory distress, speaking full sentences ABD/GI: Normal bowel sounds; non-distended; soft, non-tender, no rebound, no guarding, no peritoneal signs, no hepatosplenomegaly BACK:  The back appears normal and is non-tender to palpation, there is no CVA tenderness EXT: Normal ROM in all joints; non-tender to palpation;  no edema; normal capillary refill; no cyanosis, no calf tenderness or swelling    SKIN: Normal color for age and race; warm; no rash NEURO: Moves all extremities equally, no facial asymmetry, normal speech PSYCH: The patient's mood and manner are appropriate. Grooming and personal hygiene are appropriate.  MEDICAL DECISION MAKING: Patient here with nausea, vomiting and diarrhea.  Differential includes DKA, HHS, viral gastroenteritis, dehydration.  Abdominal exam benign.   Doubt appendicitis, colitis, diverticulitis, cholecystitis, pancreatitis.  Will obtain labs with venous blood gas, urinalysis.  Will start IV fluids and if potassium is within normal limits will start IV insulin.  I do not feel he needs emergent imaging of his abdomen at this time.  ED PROGRESS: Labs consistent with DKA.  Also has acute kidney injury.  Will continue IV hydration and IV insulin has been ordered.  Potassium of 6.1.  Will discuss with medicine.  He is mentating normally without coo small respirations and benign abdominal exam.  I do not feel he needs to the ICU at this time.  5:55 AM Discussed patient's case with hospitatlist, Dr. Alcario Drought.  I have recommended admission and patient (and family if present) agree with this plan. Admitting physician will place admission orders.   I reviewed all nursing notes, vitals, pertinent previous records, EKGs, lab and urine results, imaging (as available).  Patient has a leukocytosis of 20,000 with left shift but I suspect this is reactive.  His abdominal exam is still benign and he has no fever.  Urine does not appear infected.  No cough, congestion, headache, neck pain or neck stiffness.  Rectal temp 96.4 F.    Labs show potassium of 6.1 and EKG shows some peaked T waves.  We will continue IV fluids, IV insulin and give IV calcium.   pH is 7.061 on blood gas with a bicarb of 11.9.  I do not feel he needs IV bicarb at this time.    Discussed with lab.  Patient's potassium is 6.7 without hemolysis.  Will give albuterol and bolus insulin given he is having some EKG changes.  Will also give albuterol neb.  His bicarb is 10. Anion gap is 38.  Urine has ketones. Pseudohyponatremia secondary to hyperglycemia with sodium of 118.  Sodium is 1310.  Corrected his sodium is 137.   7:10 AM  Repeat exam reveals that the patient is tachycardic in the 110's.  He is still mentating normally.  Abdominal exam still benign.  Updated plan for  admission.   Oncoming EDP is also aware of the patient and will follow closely.  He has not yet been seen by the hospitalist admitting team.   Ralphael Southgate was evaluated in Emergency Department on 07/03/2019 for the symptoms described in the history of present illness. He was evaluated in the context of the global COVID-19 pandemic, which necessitated consideration that the patient might be at risk for infection with the SARS-CoV-2 virus that causes COVID-19. Institutional protocols and algorithms that pertain to the evaluation of patients at risk for COVID-19 are in a state of rapid change based on information released by regulatory bodies including the CDC and federal and state organizations. These policies and algorithms were followed during the patient's care in the ED.    EKG Interpretation  Date/Time:  Thursday July 03 2019 06:12:42 EDT Ventricular Rate:  108 PR Interval:    QRS Duration: 97 QT Interval:  327 QTC Calculation: 439 R Axis:   85 Text Interpretation: Sinus tachycardia Nonspecific repol abnormality, diffuse  leads Peaked T waves new compared to prior Confirmed by Evelise Reine, Cyril Mourning [id number]) on 07/03/2019 6:33:37 AM        CRITICAL CARE Performed by: Cyril Mourning Ricci Dirocco   Total critical care time: 65 minutes  Critical care time was exclusive of separately billable procedures and treating other patients.  Critical care was necessary to treat or prevent imminent or life-threatening deterioration.  Critical care was time spent personally by me on the following activities: development of treatment plan with patient and/or surrogate as well as nursing, discussions with consultants, evaluation of patient's response to treatment, examination of patient, obtaining history from patient or surrogate, ordering and performing treatments and interventions, ordering and review of laboratory studies, ordering and review of radiographic studies, pulse oximetry and re-evaluation of  patient's condition.    Delilah Mulgrew, Delice Bison, DO 07/03/19 [phone]

## 2019-07-03 NOTE — ED Notes (Signed)
Patient's wife called requesting an update, patient stated for RN to just tell his wife that he was in the hospital and going to be admitted. RN relayed this information and informed patient's wife to give patient a call as he has his cell phone. Patient's wife reported she would call him.

## 2019-07-03 NOTE — ED Notes (Signed)
Admitting MD stated not to repeat VBG if unable to obtain as he will be placing orders for more bloodwork

## 2019-07-03 NOTE — ED Notes (Addendum)
Pt. CBG greater than 600 on glucometer, RN, Mortimer Fries made aware.

## 2019-07-04 DIAGNOSIS — N189 Chronic kidney disease, unspecified: Secondary | ICD-10-CM

## 2019-07-04 DIAGNOSIS — E869 Volume depletion, unspecified: Secondary | ICD-10-CM

## 2019-07-04 DIAGNOSIS — E1010 Type 1 diabetes mellitus with ketoacidosis without coma: Secondary | ICD-10-CM

## 2019-07-04 LAB — GLUCOSE, CAPILLARY
Glucose-Capillary: 333 mg/dL — ABNORMAL HIGH (ref 70–99)
Glucose-Capillary: 314 mg/dL — ABNORMAL HIGH (ref 70–99)
Glucose-Capillary: 235 mg/dL — ABNORMAL HIGH (ref 70–99)
Glucose-Capillary: 212 mg/dL — ABNORMAL HIGH (ref 70–99)

## 2019-07-04 LAB — BASIC METABOLIC PANEL
Anion gap: 12 (ref 5–15)
BUN: 31 mg/dL — ABNORMAL HIGH (ref 6–20)
CO2: 29 mmol/L (ref 22–32)
Calcium: 8.5 mg/dL — ABNORMAL LOW (ref 8.9–10.3)
Chloride: 101 mmol/L (ref 98–111)
Creatinine, Ser: 1.39 mg/dL — ABNORMAL HIGH (ref 0.61–1.24)
GFR calc Af Amer: >60 mL/min (ref >60)
GFR calc non Af Amer: >60 mL/min (ref >60)
Glucose, Bld: 126 mg/dL — ABNORMAL HIGH (ref 70–99)
Potassium: 3.6 mmol/L (ref 3.5–5.1)
Sodium: 142 mmol/L (ref 135–145)

## 2019-07-04 MED ORDER — PHENOL 1.4 % MT LIQD
1.0000 | OROMUCOSAL | Status: DC | PRN
  Administered 2019-07-04: 1 via OROMUCOSAL
  Filled 2019-07-04: qty 177

## 2019-07-04 MED ORDER — PRO-STAT SUGAR FREE PO LIQD
30.0000 mL | Freq: Three times a day (TID) | ORAL | Status: DC
  Administered 2019-07-04 – 2019-07-05 (×2): 30 mL via ORAL
  Filled 2019-07-04 (×3): qty 30

## 2019-07-04 MED ORDER — INSULIN GLARGINE 100 UNIT/ML ~~LOC~~ SOLN
20.0000 [IU] | Freq: Two times a day (BID) | SUBCUTANEOUS | Status: DC
  Administered 2019-07-04 – 2019-07-05 (×3): 20 [IU] via SUBCUTANEOUS
  Filled 2019-07-04 (×4): qty 0.2

## 2019-07-04 MED ORDER — ADULT MULTIVITAMIN W/MINERALS CH
1.0000 | ORAL_TABLET | Freq: Every day | ORAL | Status: DC
  Administered 2019-07-04 – 2019-07-05 (×2): 1 via ORAL
  Filled 2019-07-04 (×2): qty 1

## 2019-07-04 NOTE — Progress Notes (Signed)
Initial Nutrition Assessment-  DOCUMENTATION CODES:   Not applicable  INTERVENTION:   will order 30 mL Prostat TID, each supplement provides 100 kcal and 15 grams of protein. - will order daily multivitamin with minerals. - continue to encourage PO intakes.  - will attempt to provide diet education prior to d/c.    NUTRITION DIAGNOSIS:   Inadequate oral intake related to vomiting, nausea, acute illness as evidenced by per patient/family report.  GOAL:   Patient will meet greater than or equal to 90% of their needs  MONITOR:   PO intake, Supplement acceptance, Labs, Weight trends  REASON FOR ASSESSMENT:   Consult Assessment of nutrition requirement/status  ASSESSMENT:   48 y.o. African-American male with past medical history significant for DM on insulin, hypothyroidism, and HTN. He presented to the ED on 10/29 with N/V/D and inability to keep anything down. He reported he stopped taking his insulin. He was found to be in DKA. It was determined that patient is under a lot of stress due to going through a divorce. He also reported needing financial help with insulin.  Diet advanced from NPO to Carb Modified yesterday at 1844 and no intakes documented since that time. RD sat outside of patient's room for a prolonged period during which he was on the phone the entire time. Unable to talk with patient at this time.   Per chart review, current weight is 169 lb and weight on 9/4 was 184 lb. This indicates 15 lb weight loss (8% body weight) in the past ~2 months. Weight on 10/23/18 was 196 lb which indicates 27 lb weight loss (13.8% body weight) in the past 8.5 months.  Per notes: - DKA--resolved - hyperK--resolved - N/V/D--thought to be 2/2 DKA vs gastroenteritis, resolved  - hopeful for d/c home on 10/31   Labs reviewed; CBG: 333 mg/dl, BUN: 31 mg/dl, creatinine: 1.39 mg/dl, Ca: 8.5 mg/dl. Last HgbA1c was in September: 14%.  Medications reviewed; sliding scale novolog, 20 units  lantus BID, 25 mcg oral synthroid/day.    NUTRITION - FOCUSED PHYSICAL EXAM:  unable to complete at this time.   Diet Order:   Diet Order            Diet Carb Modified Fluid consistency: Thin; Room service appropriate? Yes  Diet effective now              EDUCATION NEEDS:   Not appropriate for education at this time  Skin:  Skin Assessment: Reviewed RN Assessment  Last BM:  PTA/unknown  Height:   Ht Readings from Last 1 Encounters:  07/03/19 6' (1.829 m)    Weight:   Wt Readings from Last 1 Encounters:  07/03/19 76.7 kg    Ideal Body Weight:  80.9 kg  BMI:  Body mass index is 22.93 kg/m.  Estimated Nutritional Needs:   Kcal:  2100-2300  Protein:  105-115 grams  Fluid:  >/= 2.2 L/day      Jarome Matin, MS, RD, LDN, [facility] Inpatient Clinical Dietitian Pager # [phone] After hours/weekend pager # [phone]

## 2019-07-04 NOTE — Progress Notes (Signed)
Floor coverage  Pt adm w/ DKA. Off insulin drip at 11PM. IVF went from NS to D5 1/2NS per protocol. N/V resolved.  Plan 1. D/c IVF, convert to saline lock 2. Diet - carb modified.  Randol Kern, MD [facility] [phone]

## 2019-07-04 NOTE — Progress Notes (Signed)
Inpatient Diabetes Program Recommendations  AACE/ADA: New Consensus Statement on Inpatient Glycemic Control (2015)  Target Ranges:  Prepandial:   less than 140 mg/dL      Peak postprandial:   less than 180 mg/dL (1-2 hours)      Critically ill patients:  140 - 180 mg/dL   Lab Results  Component Value Date   GLUCAP 314 (H) 07/04/2019   HGBA1C >14.0 (H) 05/09/2019    Review of Glycemic Control  Long discussion with pt regarding his HgbA1C of > 14%. Pt states he stopped taking his insulin (Novolog 70/30 32 units bid) 2 weeks ago since he lost his insurance and could not afford his insulin. Pt has meter and supplies at home. Discussed ReliOn brand of 70/30 insulin at [facility] which costs $44 for pen prescription. Pt's head came off the bed, as he was so excited he could get 70/30 insulin for $44. Also discussed importance of eating healthy, exercising and checking blood sugars at least 3x/day. Pt states he will f/u with his PCP when discharged from hospital.  Inpatient Diabetes Program Recommendations:     Start 70/30 insulin on 10/31 - 30 units bid If still unable to eat full meal, reduce to 24 units bid Continue Novolog 0-9 units tidwc and 0-5 units QHS D/C Lantus orders when 70/30 begins.  Pt seems motivated to control his diabetes, unfortunately, he could not afford insulin without insurance.   For discharge, order Novolin 70/30 (ReliOn brand)  Continue to follow while inpatient.  Thank you. Lorenda Peck, RD, LDN, CDE Inpatient Diabetes Coordinator [phone]

## 2019-07-04 NOTE — Progress Notes (Signed)
PROGRESS NOTE    Christopher Horne  ZMO:[id number] DOB: [birth year] DOA: 07/03/2019 PCP: Luetta Nutting, DO  Outpatient Specialists:     Brief Narrative:  Patient is a 48 y.o. African-American male, with past medical history significant for diabetes mellitus on insulin, hypothyroidism and hypertension.  Patient presented with nausea, vomiting and diarrhea.  According to the patient, he was not able to keep anything down.  Patient also stopped taking his insulin.  On presentation, patient was found to be in diabetic ketoacidosis, with anion gap of 38.  Patient was admitted to ICU and started on DKA management protocol.  DKA has resolved.  Patient has been transitioned to subcutaneous Lantus insulin and sliding scale insulin coverage.  Will increase the dose of subcutaneous Lantus from 10 units once daily to 20 units twice daily.  We will continue to monitor patient's blood sugar closely.  On further questioning, patient reported being under stress.  Apparently, patient is currently undergoing divorce.  Patient also mentioned that he will need help with insulin.  Will consult diabetic educator, as well as, social work team.  Will transfer patient to the medical floor.  Hopefully, patient will be discharged back on tomorrow.  No nausea or vomiting.  No diarrhea.  No fever or chills.  No other constitutional symptoms reported.   Assessment & Plan:   Active Problems:   DKA (diabetic ketoacidoses) ([place])   Diabetic ketoacidosis: -Patient was admitted and started on insulin drip and aggressive hydration.   -DKA has resolved.   -Patient was transitioned to subcutaneous Lantus insulin 10 units daily, as well as, sliding scale insulin coverage.   -Blood sugar is currently above 300.   -We will increase subcutaneous Lantus insulin to 20 units twice daily.  Continue sliding scale insulin coverage. -HbA1c checked in September was 14%. -Diabetes education. -Social work consult. -Hopefully, patient will be  discharged back on tomorrow.  Hyperkalemia: -Potassium is 3.6.  Initial hyperkalemia noted was likely secondary to hyperglycemia (osmotic drag). -Continue to monitor electrolytes and renal function closely.  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 -AKI has resolved with aggressive hydration. -Continue to monitor renal function and electrolytes closely. -ACE inhibitor is currently on hold.  Will likely resume ACE inhibitor prior to discharge tomorrow. -Repeat renal function in the morning.  Nausea vomiting and diarrhea: -Possibly secondary to diabetic ketoacidosis.  However, the diarrhea was also documented.  Therefore, cannot rule out gastroenteritis.  -GI symptoms have resolved.   Leukocytosis: -Likely reactive. -Repeat CBC plus differential.  Hypothyroidism: -Continue Synthroid.  Hypertension: -Optimized. -Like to resume ACE inhibitor tomorrow.  DVT prophylaxis: Subcutaneous Lovenox Code Status: Full code Family Communication:  Disposition Plan: Hopefully, patient be discharged back home tomorrow.   Consultants:   None  Procedures:   None  Antimicrobials:   None   Subjective: No nausea or vomiting No other constitutional symptoms reported  Objective: Vitals:   07/04/19 0400 07/04/19 0500 07/04/19 0600 07/04/19 0800  BP: 119/69 101/61 120/67 134/77  Pulse: (!) 102 (!) 102 (!) 101 (!) 102  Resp: 13 14 13 15   Temp:    98.3 F (36.8 C)  TempSrc:    Oral  SpO2: 97% 96% 96% 97%  Weight:      Height:        Intake/Output Summary (Last 24 hours) at 07/04/2019 1010 Last data filed at 07/04/2019 0600 Gross per 24 hour  Intake 1201.54 ml  Output 850 ml  Net 351.54 ml   Filed Weights   07/03/19 0709 07/03/19 1700  Weight: 90.7 kg 76.7 kg    Examination:  General exam: Appears calm and comfortable  Respiratory system: Clear to auscultation. Respiratory  effort normal. Cardiovascular system: S1 & S2 heard Gastrointestinal system: Abdomen is nondistended, soft and nontender. No organomegaly or masses felt. Normal bowel sounds heard. Central nervous system: Alert and oriented.  Patient moves all extremities.   Extremities: No leg edema.    Data Reviewed: I have personally reviewed following labs and imaging studies  CBC: Recent Labs  Lab 07/03/19 0545 07/03/19 0549  WBC  --  20.2*  NEUTROABS  --  18.0*  HGB 17.7* 14.6  HCT 52.0 47.7  MCV  --  87.2  PLT  --  455*   Basic Metabolic Panel: Recent Labs  Lab 07/03/19 0545 07/03/19 0549 07/03/19 0712 07/03/19 1607 07/04/19 0146  NA 115* 118* 124* 141 142  K 6.1* 6.7* 5.3* 3.5 3.6  CL 86* 70* 80* 98 101  CO2  --  10* 8* 29 29  GLUCOSE >700* 1,310* 1,091* 240* 126*  BUN 60* 57* 55* 44* 31*  CREATININE 2.40* 3.10* 2.85* 2.00* 1.39*  CALCIUM  --  8.7* 8.4* 9.0 8.5*   GFR: Estimated Creatinine Clearance: 72 mL/min (A) (by C-G formula based on SCr of 1.39 mg/dL (H)). Liver Function Tests: Recent Labs  Lab 07/03/19 0549  AST 15  ALT 21  ALKPHOS 99  BILITOT 1.6*  PROT 7.9  ALBUMIN 4.0   Recent Labs  Lab 07/03/19 0549  LIPASE 18   No results for input(s): AMMONIA in the last 168 hours. Coagulation Profile: No results for input(s): INR, PROTIME in the last 168 hours. Cardiac Enzymes: No results for input(s): CKTOTAL, CKMB, CKMBINDEX, TROPONINI in the last 168 hours. BNP (last 3 results) No results for input(s): PROBNP in the last 8760 hours. HbA1C: No results for input(s): HGBA1C in the last 72 hours. CBG: Recent Labs  Lab 07/03/19 1956 07/03/19 2055 07/03/19 2226 07/03/19 2322 07/04/19 0812  GLUCAP 116* 116* 143* 222* 333*   Lipid Profile: No results for input(s): CHOL, HDL, LDLCALC, TRIG, CHOLHDL, LDLDIRECT in the last 72 hours. Thyroid Function Tests: No results for input(s): TSH, T4TOTAL, FREET4, T3FREE, THYROIDAB in the last 72 hours. Anemia Panel: No  results for input(s): VITAMINB12, FOLATE, FERRITIN, TIBC, IRON, RETICCTPCT in the last 72 hours. Urine analysis:    Component Value Date/Time   COLORURINE STRAW (A) 07/03/2019 0622   APPEARANCEUR CLEAR 07/03/2019 0622   LABSPEC 1.021 07/03/2019 0622   PHURINE 5.0 07/03/2019 0622   GLUCOSEU >=500 (A) 07/03/2019 0622   HGBUR NEGATIVE 07/03/2019 0622   BILIRUBINUR NEGATIVE 07/03/2019 0622   BILIRUBINUR neg 05/09/2019 1618   KETONESUR 20 (A) 07/03/2019 0622   PROTEINUR NEGATIVE 07/03/2019 0622   UROBILINOGEN 0.2 05/09/2019 1618   UROBILINOGEN 1.0 01/20/2014 1923   NITRITE NEGATIVE 07/03/2019 0622   LEUKOCYTESUR NEGATIVE 07/03/2019 0622   Sepsis Labs: @LABRCNTIP (procalcitonin:4,lacticidven:4)  ) Recent Results (from the past 240 hour(s))  SARS Coronavirus 2 by RT PCR (hospital order, performed in [facility] hospital lab) Nasopharyngeal Nasopharyngeal Swab     Status: None   Collection Time: 07/03/19  6:49 AM   Specimen: Nasopharyngeal Swab  Result Value Ref Range Status   SARS Coronavirus 2 NEGATIVE NEGATIVE Final    Comment: (NOTE) If result is NEGATIVE SARS-CoV-2 target nucleic acids are NOT DETECTED. The SARS-CoV-2 RNA is generally detectable in upper and lower  respiratory specimens during the acute phase of infection. The lowest  concentration of SARS-CoV-2 viral copies this assay can detect is 250  copies / mL. A negative result does not preclude SARS-CoV-2 infection  and should not be used as the sole basis for treatment or other  patient management decisions.  A negative result may occur with  improper specimen collection / handling, submission of specimen other  than nasopharyngeal swab, presence of viral mutation(s) within the  areas targeted by this assay, and inadequate number of viral copies  (<250 copies / mL). A negative result must be combined with clinical  observations, patient history, and epidemiological information. If result is POSITIVE SARS-CoV-2 target  nucleic acids are DETECTED. The SARS-CoV-2 RNA is generally detectable in upper and lower  respiratory specimens dur ing the acute phase of infection.  Positive  results are indicative of active infection with SARS-CoV-2.  Clinical  correlation with patient history and other diagnostic information is  necessary to determine patient infection status.  Positive results do  not rule out bacterial infection or co-infection with other viruses. If result is PRESUMPTIVE POSTIVE SARS-CoV-2 nucleic acids MAY BE PRESENT.   A presumptive positive result was obtained on the submitted specimen  and confirmed on repeat testing.  While 2019 novel coronavirus  (SARS-CoV-2) nucleic acids may be present in the submitted sample  additional confirmatory testing may be necessary for epidemiological  and / or clinical management purposes  to differentiate between  SARS-CoV-2 and other Sarbecovirus currently known to infect humans.  If clinically indicated additional testing with an alternate test  methodology [phone]([id number]) is advised. The SARS-CoV-2 RNA is generally  detectable in upper and lower respiratory sp ecimens during the acute  phase of infection. The expected result is Negative. Fact Sheet for Patients:  BoilerBrush.com.cyhttps://www.fda.gov/media/136312/download Fact Sheet for Healthcare Providers: https://pope.com/https://www.fda.gov/media/136313/download This test is not yet approved or cleared by the [place] FDA and has been authorized for detection and/or diagnosis of SARS-CoV-2 by FDA under an Emergency Use Authorization (EUA).  This EUA will remain in effect (meaning this test can be used) for the duration of the COVID-19 declaration under Section 564(b)(1) of the Act, 21 U.S.C. section 360bbb-3(b)(1), unless the authorization is terminated or revoked sooner. Performed at [facility], [address] [place], [place] [address]   MRSA PCR Screening     Status: None   Collection Time: 07/03/19  3:54  PM   Specimen: Nasopharyngeal  Result Value Ref Range Status   MRSA by PCR NEGATIVE NEGATIVE Final    Comment:        The GeneXpert MRSA Assay (FDA approved for NASAL specimens only), is one component of a comprehensive MRSA colonization surveillance program. It is not intended to diagnose MRSA infection nor to guide or monitor treatment for MRSA infections. Performed at [facility], [address] [place], [place] [address]      Radiology Studies: No results found.  Scheduled Meds: . Chlorhexidine Gluconate Cloth  6 each Topical Daily  . enoxaparin (LOVENOX) injection  40 mg Subcutaneous Q24H  . insulin aspart  0-5 Units Subcutaneous QHS  . insulin aspart  0-9 Units Subcutaneous TID WC  . insulin glargine  20 Units Subcutaneous BID  . levothyroxine  25 mcg Oral Q0600   Continuous Infusions:   LOS: 1 day    Time spent: 35 minutes    Berton MountSylvester Jackelyn Illingworth, MD  [facility] Pager #: [phone] 7PM-7AM contact night coverage as above

## 2019-07-05 LAB — RENAL FUNCTION PANEL
Albumin: 3.3 g/dL — ABNORMAL LOW (ref 3.5–5.0)
Anion gap: 12 (ref 5–15)
BUN: 22 mg/dL — ABNORMAL HIGH (ref 6–20)
CO2: 30 mmol/L (ref 22–32)
Calcium: 8.7 mg/dL — ABNORMAL LOW (ref 8.9–10.3)
Chloride: 99 mmol/L (ref 98–111)
Creatinine, Ser: 1.13 mg/dL (ref 0.61–1.24)
GFR calc Af Amer: >60 mL/min (ref >60)
GFR calc non Af Amer: >60 mL/min (ref >60)
Glucose, Bld: 193 mg/dL — ABNORMAL HIGH (ref 70–99)
Phosphorus: 2.6 mg/dL (ref 2.5–4.6)
Potassium: 3.4 mmol/L — ABNORMAL LOW (ref 3.5–5.1)
Sodium: 141 mmol/L (ref 135–145)

## 2019-07-05 LAB — MAGNESIUM: Magnesium: 2.3 mg/dL (ref 1.7–2.4)

## 2019-07-05 LAB — CBC WITH DIFFERENTIAL/PLATELET
Abs Immature Granulocytes: 0.03 10*3/uL (ref 0.00–0.07)
Basophils Absolute: 0 10*3/uL (ref 0.0–0.1)
Basophils Relative: 0 %
Eosinophils Absolute: 0 10*3/uL (ref 0.0–0.5)
Eosinophils Relative: 0 %
HCT: 38.3 % — ABNORMAL LOW (ref 39.0–52.0)
Hemoglobin: 12.6 g/dL — ABNORMAL LOW (ref 13.0–17.0)
Immature Granulocytes: 0 %
Lymphocytes Relative: 19 %
Lymphs Abs: 1.5 10*3/uL (ref 0.7–4.0)
MCH: 27 pg (ref 26.0–34.0)
MCHC: 32.9 g/dL (ref 30.0–36.0)
MCV: 82 fL (ref 80.0–100.0)
Monocytes Absolute: 0.8 10*3/uL (ref 0.1–1.0)
Monocytes Relative: 10 %
Neutro Abs: 5.7 10*3/uL (ref 1.7–7.7)
Neutrophils Relative %: 71 %
Platelets: 295 10*3/uL (ref 150–400)
RBC: 4.67 MIL/uL (ref 4.22–5.81)
RDW: 13.1 % (ref 11.5–15.5)
WBC: 8 10*3/uL (ref 4.0–10.5)
nRBC: 0 % (ref 0.0–0.2)

## 2019-07-05 LAB — GLUCOSE, CAPILLARY: Glucose-Capillary: 244 mg/dL — ABNORMAL HIGH (ref 70–99)

## 2019-07-05 MED ORDER — POTASSIUM CHLORIDE CRYS ER 20 MEQ PO TBCR
40.0000 meq | EXTENDED_RELEASE_TABLET | Freq: Once | ORAL | Status: AC
  Administered 2019-07-05: 40 meq via ORAL
  Filled 2019-07-05: qty 2

## 2019-07-05 MED ORDER — ADULT MULTIVITAMIN W/MINERALS CH: 1.0000 | ORAL_TABLET | Freq: Every day | ORAL | 1 refills | Status: AC

## 2019-07-05 MED ORDER — NOVOLOG MIX 70/30 FLEXPEN (70-30) 100 UNIT/ML ~~LOC~~ SUPN: 35.0000 [IU] | PEN_INJECTOR | Freq: Two times a day (BID) | SUBCUTANEOUS | 3 refills | Status: DC

## 2019-07-05 NOTE — Discharge Summary (Signed)
Physician Discharge Summary  Patient ID: Gilman Olazabal MRN: [id number] DOB/AGE: 1971-06-20 48 y.o.  Admit date: 07/03/2019 Discharge date: 07/05/2019  Admission Diagnoses:  Discharge Diagnoses:  Active Problems:   DKA (diabetic ketoacidoses) (HCC)   Acute kidney injury, likely prerenal.   Volume depletion   Leukocytosis, likely reactive   Hypertension   Likely noncompliance  Discharged Condition: stable  Hospital Course:  Patient is a  48 year old African-American male, with past medical history significant for diabetes mellitus on insulin, hypothyroidism and hypertension.  Patient presented with nausea, vomiting and diarrhea.  According to the patient, he was not able to keep anything down.  Patient had also stopped taking his insulin.  On presentation, patient was found to be in diabetic ketoacidosis, with anion gap of 38.  Patient was admitted to ICU and started on DKA management protocol.  DKA resolved, and Patient was transitioned to subcutaneous Lantus insulin and sliding scale insulin coverage.  Patient's blood sugar control has been optimized.  Patient will be discharged back home to the care of the primary care provider.  During the hospital stay, patient reported he had been under stress.  Apparently, patient is currently undergoing divorce, and is financially challenged .  Patient also indicated that he would need help with insulin.  Diabetic educator and social work team were consulted to assist with patient's discharge needs.  Patient will follow with a primary care provider on discharge.   Diabetic ketoacidosis: -Patient was admitted and started on insulin drip and aggressive hydration.   -DKA has resolved.   -Patient was transitioned to subcutaneous Lantus insulin 10 units daily, as well as, sliding scale insulin coverage.   -Blood sugar was optimized during the hospital stay.   -HbA1c checked in September was 14%. -Diabetes educator was consulted to assist with  patient's management.. -Social work team was consulted to assist with social circumstances on discharge.  -Patient's primary care provider will kindly continue to optimize patient's diabetes mellitus management.    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 -AKI has resolved with aggressive hydration. -Continue to monitor renal function and electrolytes closely. -ACE inhibitor therapy will be resumed once renal function permits.  Nausea vomiting and diarrhea: -Likely secondary to diabetic ketoacidosis.   -However, the diarrhea was also documented.   -Patient could also have gastroenteritis.   -GI symptoms are managed supportively.   -GI symptoms have resolved.   Leukocytosis: -Likely reactive, and resolved.  Hypothyroidism: -Continue Synthroid.  Hypertension: -Optimized.  Consults: None  Significant Diagnostic Studies:  labs:   Discharge Exam: Blood pressure 137/88, pulse 93, temperature 99.1 F (37.3 C), temperature source Oral, resp. rate 16, height 6' (1.829 m), weight 76.7 kg, SpO2 95 %.  Disposition: Discharge disposition: 01-Home or Self Care   Discharge Instructions    Diet - low sodium heart healthy   Complete by: As directed    Diet Carb Modified   Complete by: As directed    Increase activity slowly   Complete by: As directed      Allergies as of 07/05/2019   No Known Allergies     Medication List    STOP taking these medications   acetaminophen 325 MG tablet Commonly known as: TYLENOL     TAKE these medications   blood glucose meter kit and supplies Kit Dispense based on patient and insurance preference. Use up to four times daily as directed.   enalapril 5 MG tablet Commonly  known as: VASOTEC Take 5 mg by mouth daily.   glucose blood test strip Commonly known as: OneTouch Verio Use as instructed to check blood sugar 3 times per day dx code    Insulin Pen Needle 31G X 5 MM Misc Commonly known as: B-D UF III MINI PEN NEEDLES Use to inject insulin 2/day.   levothyroxine 25 MCG tablet Commonly known as: SYNTHROID Take 1 tablet (25 mcg total) by mouth daily before breakfast.   multivitamin with minerals Tabs tablet Take 1 tablet by mouth daily.   NovoLOG Mix 70/30 FlexPen (70-30) 100 UNIT/ML FlexPen Generic drug: insulin aspart protamine - aspart Inject 0.35 mLs (35 Units total) into the skin 2 (two) times daily with a meal.   OneTouch Delica Lancets Fine Misc Use to check blood sugar 3 times per day dx code E11.9        Signed: Bonnell Public 07/05/2019, 11:59 AM

## 2019-07-05 NOTE — TOC Initial Note (Signed)
Transition of Care [facility]) - Initial/Assessment Note    Patient Details  Name: Christopher Horne MRN: [id number] Date of Birth: October 27, 1970  [facility]) CM/SW Contact:    Armanda Heritage, RN Phone Number: 07/05/2019, 1:14 PM  Clinical Narrative:      CM spoke with patient at bedside. Patient reports he has lost his insurance since being laid off from work and cannot afford his medications.  Patient provided with Match letter for his discharge medications. CM lso spoke with patient about primary care, at this time patient does have a pcp and will reach out to the office to see if they can assist him with cost of care or medications. However if patient finds that he cannot afford to continue to see his pcp now that he is uninsured, he would like to switch his care to the [facility] and apply for their financial assistance. Information for [facility] placed on AVS for patient reference.               Expected Discharge Plan: Home/Self Care Barriers to Discharge: No Barriers Identified   Patient Goals and CMS Choice Patient states their goals for this hospitalization and ongoing recovery are:: to go home today      Expected Discharge Plan and Services Expected Discharge Plan: Home/Self Care   Discharge Planning Services: CM Consult   Living arrangements for the past 2 months: Single Family Home Expected Discharge Date: 07/05/19               DME Arranged: N/A DME Agency: NA       HH Arranged: NA HH Agency: NA        Prior Living Arrangements/Services Living arrangements for the past 2 months: Single Family Home Lives with:: Spouse Patient language and need for interpreter reviewed:: Yes Do you feel safe going back to the place where you live?: Yes      Need for Family Participation in Patient Care: No (Comment) Care giver support system in place?: Yes (comment)   Criminal Activity/Legal Involvement Pertinent to Current Situation/Hospitalization: No - Comment as  needed  Activities of Daily Living Home Assistive Devices/Equipment: CBG Meter, Eyeglasses ADL Screening (condition at time of admission) Patient's cognitive ability adequate to safely complete daily activities?: Yes Is the patient deaf or have difficulty hearing?: No Does the patient have difficulty seeing, even when wearing glasses/contacts?: No Does the patient have difficulty concentrating, remembering, or making decisions?: No Patient able to express need for assistance with ADLs?: Yes Does the patient have difficulty dressing or bathing?: Yes Independently performs ADLs?: No Communication: Independent Dressing (OT): Needs assistance Is this a change from baseline?: Change from baseline, expected to last >3 days Grooming: Needs assistance Is this a change from baseline?: Change from baseline, expected to last >3 days Feeding: Needs assistance Is this a change from baseline?: Change from baseline, expected to last >3 days Bathing: Needs assistance Is this a change from baseline?: Change from baseline, expected to last >3 days Toileting: Needs assistance Is this a change from baseline?: Change from baseline, expected to last >3days In/Out Bed: Needs assistance Is this a change from baseline?: Change from baseline, expected to last >3 days Walks in Home: Needs assistance Is this a change from baseline?: Change from baseline, expected to last >3 days Does the patient have difficulty walking or climbing stairs?: No Weakness of Legs: Both Weakness of Arms/Hands: None  Permission Sought/Granted  Emotional Assessment Appearance:: Appears stated age Attitude/Demeanor/Rapport: Engaged Affect (typically observed): Accepting Orientation: : Oriented to Place, Oriented to  Time, Oriented to Situation, Oriented to Self   Psych Involvement: No (comment)  Admission diagnosis:  Hyperkalemia [E87.5] AKI (acute kidney injury) ([place]) [N17.9] Nausea vomiting and  diarrhea [R11.2, R19.7] Diabetic ketoacidosis without coma associated with type 2 diabetes mellitus ([place]) [E11.10] Patient Active Problem List   Diagnosis Date Noted  . DKA (diabetic ketoacidoses) ([place]) 07/03/2019  . Routine general medical examination at a health care facility 07/21/2016  . Acute upper respiratory infection 05/21/2015  . Fatigue 12/23/2014  . Dyslipidemia 12/11/2014  . Insulin dependent diabetes mellitus ([place]) 06/15/2014  . Hypothyroidism 06/01/2014  . Essential hypertension 06/01/2014   PCP:  Luetta Nutting, DO Pharmacy:   [id number] - [place], [place] [address] [place] [address] Phone: [phone] Fax: [phone]     Social Determinants of Health (SDOH) Interventions    Readmission Risk Interventions No flowsheet data found.

## 2019-07-07 ENCOUNTER — Ambulatory Visit: Payer: BC Managed Care – PPO | Admitting: Endocrinology

## 2019-07-07 DIAGNOSIS — R112 Nausea with vomiting, unspecified: Secondary | ICD-10-CM

## 2019-07-07 DIAGNOSIS — N179 Acute kidney failure, unspecified: Secondary | ICD-10-CM

## 2019-07-11 MED FILL — UNIFINE PENTIPS 8MM 31G: 31G X 8 MM | 30 days supply | Qty: 100 | Fill #0

## 2019-07-11 MED FILL — NOVOLOG MIX 70-30 FLEXPEN S: (70-30) 100 | 30 days supply | Qty: 21 | Fill #0

## 2019-07-30 ENCOUNTER — Other Ambulatory Visit: Payer: Self-pay | Admitting: Family Medicine

## 2019-08-07 ENCOUNTER — Other Ambulatory Visit: Payer: Self-pay

## 2019-08-11 ENCOUNTER — Ambulatory Visit: Payer: Self-pay | Admitting: Internal Medicine

## 2019-08-18 ENCOUNTER — Other Ambulatory Visit: Payer: Self-pay | Admitting: Family Medicine

## 2019-08-18 ENCOUNTER — Telehealth: Payer: Self-pay | Admitting: Family Medicine

## 2019-08-18 ENCOUNTER — Telehealth: Payer: Self-pay | Admitting: *Deleted

## 2019-08-18 MED ORDER — LEVOTHYROXINE SODIUM 25 MCG PO TABS: 25.0000 ug | ORAL_TABLET | Freq: Every day | ORAL | 0 refills | Status: DC

## 2019-08-18 MED ORDER — ENALAPRIL MALEATE 5 MG PO TABS: 5.0000 mg | ORAL_TABLET | Freq: Every day | ORAL | 1 refills | Status: DC

## 2019-08-18 MED ORDER — NOVOLOG MIX 70/30 FLEXPEN (70-30) 100 UNIT/ML ~~LOC~~ SUPN: 35.0000 [IU] | PEN_INJECTOR | Freq: Two times a day (BID) | SUBCUTANEOUS | 3 refills | Status: DC

## 2019-08-18 MED FILL — LEVOTHYROXINE 25 MCG TABLET: 25 | 30 days supply | Qty: 30 | Fill #0

## 2019-08-18 MED FILL — ENALAPRIL MALEATE 5 MG TAB: 5 | 30 days supply | Qty: 30 | Fill #0

## 2019-08-18 MED FILL — NOVOLOG MIX 70-30 FLEXPEN S: (70-30) 100 | 30 days supply | Qty: 21 | Fill #0

## 2019-08-18 NOTE — Telephone Encounter (Signed)
[facility]  

## 2019-08-18 NOTE — Telephone Encounter (Addendum)
TOC CM received call from pt stating ran out of his medications. And plans to return to ED to have filled. States he is unable to pay to see his PCP out of pocket. Contacted Dr Zigmund Daniel office and office will send message to PCP for referral and arrange appt. Pt will have to pay $80 to see PCP and they will bill for other amount. TOC CM requesting refills until he can follow up with PCP.  Contacted pt and explained TOC CM is working on getting him a refill sent to [facility] discounted pharmacy until he can see Dr Zigmund Daniel. Pt states he will work on getting $80 to see PCP or will schedule appt for [facility]. Waiting on response from PCP. Explained to pt the importance of follow up with PCP to have medications refilled and labs checked. Verbalized understanding. Jonnie Finner RN CCM, [facility] ED TOC CM [phone]  345 pm Pt scheduled appt to follow up with his PCP on Friday, 08/22/2019. Will pick up his meds from [facility]. [place], [place] ED TOC CM [phone]

## 2019-08-18 NOTE — Telephone Encounter (Signed)
Alysia case manager Emergency Department is calling to see if Dr. Zigmund Daniel could refillinsulin aspart protamine - aspart (NOVOLOG MIX 70/30 FLEXPEN) (70-30) 100 UNIT/ML FlexPen [[id number]]  ENDED,levothyroxine (SYNTHROID) 25 MCG tablet [[id number]], enalapril (VASOTEC) 5 MG tablet [[id number]]  Patient does not have insurance. Ulyess Blossom is requesting if scripts could be sent to [facility]-[phone]-Fax [phone]

## 2019-08-22 ENCOUNTER — Other Ambulatory Visit: Payer: Self-pay

## 2019-08-22 ENCOUNTER — Ambulatory Visit (INDEPENDENT_AMBULATORY_CARE_PROVIDER_SITE_OTHER): Payer: Self-pay | Admitting: Family Medicine

## 2019-08-22 DIAGNOSIS — E139 Other specified diabetes mellitus without complications: Secondary | ICD-10-CM

## 2019-08-24 ENCOUNTER — Encounter: Payer: Self-pay | Admitting: Family Medicine

## 2019-08-24 NOTE — Progress Notes (Signed)
Christopher Horne - 48 y.o. male MRN [id number]  Date of birth: Jan 05, 1971  Subjective Chief Complaint  Patient presents with  . Follow-up    Diabetes follow up.     HPI Christopher Horne is a 48 y.o. male with history of T2DM, hypothyroidism and HLD here today for follow up of diabetes.  He report that he lost his job a couple of months ago and has had trouble affording his insulin.  He was able [facility] and case manager at hospital gave him paperwork for prescription assistance through [employer].  He has not checked blood sugars at home recently.  He denies symptoms of hypoglycemia, increased thirst or urination, nausea or fatigue.    ROS:  A comprehensive ROS was completed and negative except as noted per HPI  No Known Allergies  Past Medical History:  Diagnosis Date  . Diabetes mellitus without complication (HCC)   . Hyperlipidemia   . Thyroid disease     Past Surgical History:  Procedure Laterality Date  . EYE SURGERY      Social History   Socioeconomic History  . Marital status: Legally Separated    Spouse name: Not on file  . Number of children: 0  . Years of education: 22  . Highest education level: Not on file  Occupational History  . Occupation: Fabricator  Tobacco Use  . Smoking status: Never Smoker  . Smokeless tobacco: Never Used  Substance and Sexual Activity  . Alcohol use: Yes    Comment: once a month, beer  . Drug use: No  . Sexual activity: Not on file  Other Topics Concern  . Not on file  Social History Narrative   Currently lives with his wife. Fun: Go to the beach   Denies religious beliefs effecting health care.    Social Determinants of Health   Financial Resource Strain:   . Difficulty of Paying Living Expenses: Not on file  Food Insecurity:   . Worried About Programme researcher, broadcasting/film/video in the Last Year: Not on file  . Ran Out of Food in the Last Year: Not on file    Transportation Needs:   . Lack of Transportation (Medical): Not on file  . Lack of Transportation (Non-Medical): Not on file  Physical Activity:   . Days of Exercise per Week: Not on file  . Minutes of Exercise per Session: Not on file  Stress:   . Feeling of Stress : Not on file  Social Connections:   . Frequency of Communication with Friends and Family: Not on file  . Frequency of Social Gatherings with Friends and Family: Not on file  . Attends Religious Services: Not on file  . Active Member of Clubs or Organizations: Not on file  . Attends Banker Meetings: Not on file  . Marital Status: Not on file    Family History  Problem Relation Age of Onset  . Healthy Mother   . Diabetes Maternal Grandmother   . Diabetes Paternal Grandmother     Health Maintenance  Topic Date Due  . PNEUMOCOCCAL POLYSACCHARIDE VACCINE AGE 58-64 HIGH RISK  03/18/1975  . OPHTHALMOLOGY EXAM  07/22/2015  . FOOT EXAM  01/11/2016  . INFLUENZA VACCINE  04/05/2019  . HEMOGLOBIN A1C  11/06/2019  . TETANUS/TDAP  10/05/2021  . HIV Screening  Completed    ----------------------------------------------------------------------------------------------------------------------------------------------------------------------------------------------------------------- Physical Exam BP 122/90   Pulse 91   Temp 97.7 F (36.5 C) (Temporal)  Ht 6' (1.829 m)   Wt 187 lb (84.8 kg)   SpO2 98%   BMI 25.36 kg/m   Physical Exam Constitutional:      Appearance: Normal appearance.  HENT:     Head: Normocephalic and atraumatic.  Eyes:     General: No scleral icterus. Cardiovascular:     Rate and Rhythm: Normal rate and regular rhythm.  Pulmonary:     Effort: Pulmonary effort is normal.     Breath sounds: Normal breath sounds.  Musculoskeletal:     Cervical back: Neck supple.  Skin:    General: Skin is warm and dry.  Neurological:     General: No focal deficit present.     Mental Status: He  is alert.  Psychiatric:        Mood and Affect: Mood normal.        Behavior: Behavior normal.     ------------------------------------------------------------------------------------------------------------------------------------------------------------------------------------------------------------------- Assessment and Plan  Diabetes mellitus type 1.5, managed as type 1 ([place]) Discussed importance of self monitoring blood sugars at home and consistent use of insulin.  He expresses understanding.  Paperwork completed for insulin assistance program through [employer]. He declines updated labs today due to cost. He may look into [facility] with [facility] due to being uninsured.

## 2019-08-24 NOTE — Assessment & Plan Note (Signed)
Discussed importance of self monitoring blood sugars at home and consistent use of insulin.  He expresses understanding.  Paperwork completed for insulin assistance program through [employer]. He declines updated labs today due to cost. He may look into establishing care with [facility] due to being uninsured.

## 2019-10-08 ENCOUNTER — Other Ambulatory Visit: Payer: Self-pay | Admitting: Family Medicine

## 2019-10-08 MED ORDER — ENALAPRIL MALEATE 5 MG PO TABS: 5.0000 mg | ORAL_TABLET | Freq: Every day | ORAL | 1 refills | Status: DC

## 2019-10-31 ENCOUNTER — Other Ambulatory Visit: Payer: Self-pay | Admitting: Family Medicine

## 2019-12-02 ENCOUNTER — Other Ambulatory Visit: Payer: Self-pay | Admitting: Nurse Practitioner

## 2019-12-29 ENCOUNTER — Other Ambulatory Visit: Payer: Self-pay | Admitting: Family Medicine

## 2020-03-01 ENCOUNTER — Telehealth: Payer: Self-pay

## 2020-03-01 NOTE — Telephone Encounter (Signed)
   Spouse Tiffany calling, can you accept as new patient. Former [employer] patient

## 2020-03-04 ENCOUNTER — Ambulatory Visit: Payer: Self-pay | Admitting: Internal Medicine

## 2020-03-04 DIAGNOSIS — Z0289 Encounter for other administrative examinations: Secondary | ICD-10-CM

## 2020-10-12 ENCOUNTER — Other Ambulatory Visit: Payer: Self-pay

## 2020-10-12 ENCOUNTER — Ambulatory Visit (INDEPENDENT_AMBULATORY_CARE_PROVIDER_SITE_OTHER): Payer: PRIVATE HEALTH INSURANCE | Admitting: Family Medicine

## 2020-10-12 ENCOUNTER — Encounter: Payer: Self-pay | Admitting: Family Medicine

## 2020-10-12 VITALS — BP 164/102 | HR 92 | Temp 97.9°F | Ht 72.0 in | Wt 206.6 lb

## 2020-10-12 DIAGNOSIS — E139 Other specified diabetes mellitus without complications: Secondary | ICD-10-CM

## 2020-10-12 DIAGNOSIS — E039 Hypothyroidism, unspecified: Secondary | ICD-10-CM

## 2020-10-12 DIAGNOSIS — I10 Essential (primary) hypertension: Secondary | ICD-10-CM | POA: Diagnosis not present

## 2020-10-12 DIAGNOSIS — E785 Hyperlipidemia, unspecified: Secondary | ICD-10-CM | POA: Diagnosis not present

## 2020-10-12 MED ORDER — ENALAPRIL MALEATE 5 MG PO TABS: ORAL_TABLET | ORAL | 1 refills | Status: DC

## 2020-10-12 NOTE — Assessment & Plan Note (Signed)
Update lipid panel.  

## 2020-10-12 NOTE — Patient Instructions (Signed)
Have labs completed Restart enalapril.  See me again in 4-6 weeks for BP check.

## 2020-10-12 NOTE — Progress Notes (Signed)
Christopher Horne - 50 y.o. male MRN [id number]  Date of birth: Jan 30, 1971  Subjective Chief Complaint  Patient presents with  . Annual Exam    HPI Christopher Horne is a 50 y.o. male with history of HTN, hypothyroidism, Type 1 diabetes and HLD.  He is here to re-establish care with me.  His is currently treating diabetes with novolin 70/30, 46 units bid.  He has had variable compliance with medication and follow up.  He had been referred to endocrinology however no showed for appt x2.  He was admitted again in the fall of 2021 at [facility] for DKA.  He is requesting DMV papers to be completed.   He is not taking enalapril for HTN, levothyroxine or crestor.    ROS:  A comprehensive ROS was completed and negative except as noted per HPI  No Known Allergies  Past Medical History:  Diagnosis Date  . Diabetes mellitus without complication (HCC)   . Hyperlipidemia   . Thyroid disease     Past Surgical History:  Procedure Laterality Date  . EYE SURGERY      Social History   Socioeconomic History  . Marital status: Legally Separated    Spouse name: Not on file  . Number of children: 0  . Years of education: 52  . Highest education level: Not on file  Occupational History  . Occupation: Fabricator  Tobacco Use  . Smoking status: Never Smoker  . Smokeless tobacco: Never Used  Vaping Use  . Vaping Use: Never used  Substance and Sexual Activity  . Alcohol use: Yes    Alcohol/week: 3.0 standard drinks    Types: 3 Cans of beer per week    Comment: weekends  . Drug use: No  . Sexual activity: Yes  Other Topics Concern  . Not on file  Social History Narrative   Currently lives with his wife. Fun: Go to the beach   Denies religious beliefs effecting health care.    Social Determinants of Health   Financial Resource Strain: Not on file  Food Insecurity: Not on file  Transportation Needs: Not on file  Physical Activity: Not on file  Stress: Not on file  Social Connections:  Not on file    Family History  Problem Relation Age of Onset  . Healthy Mother   . Diabetes Maternal Grandmother   . Diabetes Paternal Grandmother   . Diabetes Maternal Aunt     Health Maintenance  Topic Date Due  . Hepatitis C Screening  Never done  . PNEUMOCOCCAL POLYSACCHARIDE VACCINE AGE 80-64 HIGH RISK  Never done  . OPHTHALMOLOGY EXAM  07/22/2015  . FOOT EXAM  01/11/2016  . COLONOSCOPY (Pts 45-53yrs Insurance coverage will need to be confirmed)  Never done  . HEMOGLOBIN A1C  11/06/2019  . INFLUENZA VACCINE  04/04/2020  . COVID-19 Vaccine (3 - Booster for Pfizer series) 12/13/2020  . TETANUS/TDAP  10/05/2021  . HIV Screening  Completed     ----------------------------------------------------------------------------------------------------------------------------------------------------------------------------------------------------------------- Physical Exam BP (!) 164/102 (BP Location: Left Arm, Patient Position: Sitting, Cuff Size: Large)   Pulse 92   Temp 97.9 F (36.6 C)   Ht 6' (1.829 m)   Wt 206 lb 9.6 oz (93.7 kg)   SpO2 100%   BMI 28.02 kg/m   Physical Exam Constitutional:      Appearance: Normal appearance.  HENT:     Head: Normocephalic and atraumatic.  Cardiovascular:     Rate and Rhythm: Normal rate and regular rhythm.  Pulmonary:  Effort: Pulmonary effort is normal.     Breath sounds: Normal breath sounds.  Musculoskeletal:     Cervical back: Neck supple.  Neurological:     General: No focal deficit present.     Mental Status: He is alert.     ------------------------------------------------------------------------------------------------------------------------------------------------------------------------------------------------------------------- Assessment and Plan  Diabetes mellitus type 1.5, managed as type 1 (HCC) History of poorly controlled diabetes. He is poorly compliant with treatment and follow up visits.   Will update  a1c, lipid and renal function.  Reviewed consequences of poorly controlled diabetes as well of risks related to DKA including death.   Hypothyroidism Updated TSH initially.  Will restart levothyroxine once labs return.   Dyslipidemia Update lipid panel.    Essential hypertension BP elevated.  Restart enalapril. F/u in 4-6 weeks for BP recheck.    Meds ordered this encounter  Medications  . enalapril (VASOTEC) 5 MG tablet    Sig: Take 1qd    Dispense:  30 tablet    Refill:  1    Return in about 5 weeks (around 11/16/2020) for HTN/T2DM.    This visit occurred during the SARS-CoV-2 public health emergency.  Safety protocols were in place, including screening questions prior to the visit, additional usage of staff PPE, and extensive cleaning of exam room while observing appropriate contact time as indicated for disinfecting solutions.

## 2020-10-12 NOTE — Assessment & Plan Note (Signed)
BP elevated.  Restart enalapril. F/u in 4-6 weeks for BP recheck.

## 2020-10-12 NOTE — Assessment & Plan Note (Signed)
History of poorly controlled diabetes. He is poorly compliant with treatment and follow up visits.   Will update a1c, lipid and renal function.  Reviewed consequences of poorly controlled diabetes as well of risks related to DKA including death.

## 2020-10-12 NOTE — Assessment & Plan Note (Signed)
Updated TSH initially.  Will restart levothyroxine once labs return.

## 2020-10-13 ENCOUNTER — Other Ambulatory Visit: Payer: Self-pay | Admitting: Family Medicine

## 2020-10-13 LAB — COMPLETE METABOLIC PANEL WITH GFR
AG Ratio: 1.6 (calc) (ref 1.0–2.5)
ALT: 15 U/L (ref 9–46)
AST: 18 U/L (ref 10–40)
Albumin: 4.2 g/dL (ref 3.6–5.1)
Alkaline phosphatase (APISO): 76 U/L (ref 36–130)
BUN: 17 mg/dL (ref 7–25)
CO2: 29 mmol/L (ref 20–32)
Calcium: 9.4 mg/dL (ref 8.6–10.3)
Chloride: 103 mmol/L (ref 98–110)
Creat: 1.33 mg/dL (ref 0.60–1.35)
GFR, Est African American: 73 mL/min/{1.73_m2} (ref >=60)
GFR, Est Non African American: 63 mL/min/{1.73_m2} (ref >=60)
Globulin: 2.7 g/dL (calc) (ref 1.9–3.7)
Glucose, Bld: 221 mg/dL — ABNORMAL HIGH (ref 65–139)
Potassium: 4.7 mmol/L (ref 3.5–5.3)
Sodium: 139 mmol/L (ref 135–146)
Total Bilirubin: 0.4 mg/dL (ref 0.2–1.2)
Total Protein: 6.9 g/dL (ref 6.1–8.1)

## 2020-10-13 LAB — HEMOGLOBIN A1C
Hgb A1c MFr Bld: 12.6 % of total Hgb — ABNORMAL HIGH (ref <5.7)
Mean Plasma Glucose: 315 mg/dL
eAG (mmol/L): 17.4 mmol/L

## 2020-10-13 LAB — LIPID PANEL W/REFLEX DIRECT LDL
Cholesterol: 246 mg/dL — ABNORMAL HIGH (ref <200)
HDL: 45 mg/dL (ref >=40)
LDL Cholesterol (Calc): 174 mg/dL (calc) — ABNORMAL HIGH
Non-HDL Cholesterol (Calc): 201 mg/dL (calc) — ABNORMAL HIGH (ref <130)
Total CHOL/HDL Ratio: 5.5 (calc) — ABNORMAL HIGH (ref <5.0)
Triglycerides: 136 mg/dL (ref <150)

## 2020-10-13 LAB — CBC
HCT: 41.3 % (ref 38.5–50.0)
Hemoglobin: 13.9 g/dL (ref 13.2–17.1)
MCH: 27.2 pg (ref 27.0–33.0)
MCHC: 33.7 g/dL (ref 32.0–36.0)
MCV: 80.8 fL (ref 80.0–100.0)
MPV: 11 fL (ref 7.5–12.5)
Platelets: 356 10*3/uL (ref 140–400)
RBC: 5.11 10*6/uL (ref 4.20–5.80)
RDW: 14 % (ref 11.0–15.0)
WBC: 5 10*3/uL (ref 3.8–10.8)

## 2020-10-13 LAB — TSH: TSH: 1.01 mIU/L (ref 0.40–4.50)

## 2020-10-13 MED ORDER — ROSUVASTATIN CALCIUM 20 MG PO TABS: 20.0000 mg | ORAL_TABLET | Freq: Every day | ORAL | 3 refills | Status: DC

## 2020-11-16 ENCOUNTER — Encounter: Payer: Self-pay | Admitting: Family Medicine

## 2020-11-16 ENCOUNTER — Ambulatory Visit (INDEPENDENT_AMBULATORY_CARE_PROVIDER_SITE_OTHER): Payer: PRIVATE HEALTH INSURANCE | Admitting: Family Medicine

## 2020-11-16 ENCOUNTER — Other Ambulatory Visit: Payer: Self-pay

## 2020-11-16 DIAGNOSIS — E139 Other specified diabetes mellitus without complications: Secondary | ICD-10-CM

## 2020-11-16 DIAGNOSIS — I10 Essential (primary) hypertension: Secondary | ICD-10-CM | POA: Diagnosis not present

## 2020-11-16 DIAGNOSIS — E785 Hyperlipidemia, unspecified: Secondary | ICD-10-CM | POA: Diagnosis not present

## 2020-11-16 MED ORDER — NOVOLOG MIX 70/30 FLEXPEN (70-30) 100 UNIT/ML ~~LOC~~ SUPN: 35.0000 [IU] | PEN_INJECTOR | Freq: Two times a day (BID) | SUBCUTANEOUS | 3 refills | Status: DC

## 2020-11-16 MED ORDER — ENALAPRIL MALEATE 5 MG PO TABS
ORAL_TABLET | ORAL | 1 refills | Status: DC
Start: 2020-11-16 — End: 2022-12-12

## 2020-11-16 MED ORDER — ROSUVASTATIN CALCIUM 20 MG PO TABS: 20.0000 mg | ORAL_TABLET | Freq: Every day | ORAL | 3 refills | Status: AC

## 2020-11-16 NOTE — Patient Instructions (Signed)

## 2020-11-16 NOTE — Assessment & Plan Note (Signed)
He will pick up rosuvastatin and start.

## 2020-11-16 NOTE — Assessment & Plan Note (Signed)
Diabetes has been poorly controlled historically. His diet is contributing significantly to poor control.  Discussed reading food labels and counting carbohydrates.  Discussed avoiding concentrated sweets such as fruit juices and candy. Declines referral to nutritionist/diabetes educator.    He will continue insulin at current strength and dosing.

## 2020-11-16 NOTE — Progress Notes (Signed)
Christopher Horne - 50 y.o. male MRN [id number]  Date of birth: 04-06-1971  Subjective Chief Complaint  Patient presents with  . Diabetes  . Hypertension    HPI Christopher Horne is a 50 y.o. male here today for follow up of diabetes and htn.  He reports that he didn't pick up enalapril at the pharmacy because they told him it would be about $200.     He has been more consistent with using his insulin twice per day.  Morning glucose around 130 when fasting but goes into the 200-300's after eating.  States that he will often eat candy, biscuit or drink fruit juices in the morning.  He denies any symptoms related to his diabetes.  He has been hospitalized a couple of times due to DKA.    BP remains elevated but has not started enalapril.  Denies chest pain, shortness of breath, palpitations, headache or vision changes.    He also has not picked up rosuvastatin but plans to start this.    ROS:  A comprehensive ROS was completed and negative except as noted per HPI  No Known Allergies  Past Medical History:  Diagnosis Date  . Diabetes mellitus without complication (HCC)   . Hyperlipidemia   . Thyroid disease     Past Surgical History:  Procedure Laterality Date  . EYE SURGERY      Social History   Socioeconomic History  . Marital status: Legally Separated    Spouse name: Not on file  . Number of children: 0  . Years of education: 41  . Highest education level: Not on file  Occupational History  . Occupation: Fabricator  Tobacco Use  . Smoking status: Never Smoker  . Smokeless tobacco: Never Used  Vaping Use  . Vaping Use: Never used  Substance and Sexual Activity  . Alcohol use: Yes    Alcohol/week: 3.0 standard drinks    Types: 3 Cans of beer per week    Comment: weekends  . Drug use: No  . Sexual activity: Yes  Other Topics Concern  . Not on file  Social History Narrative   Currently lives with his wife. Fun: Go to the beach   Denies religious beliefs effecting  health care.    Social Determinants of Health   Financial Resource Strain: Not on file  Food Insecurity: Not on file  Transportation Needs: Not on file  Physical Activity: Not on file  Stress: Not on file  Social Connections: Not on file    Family History  Problem Relation Age of Onset  . Healthy Mother   . Diabetes Maternal Grandmother   . Diabetes Paternal Grandmother   . Diabetes Maternal Aunt     Health Maintenance  Topic Date Due  . Hepatitis C Screening  Never done  . PNEUMOCOCCAL POLYSACCHARIDE VACCINE AGE 65-64 HIGH RISK  Never done  . OPHTHALMOLOGY EXAM  07/22/2015  . FOOT EXAM  01/11/2016  . COLONOSCOPY (Pts 45-53yrs Insurance coverage will need to be confirmed)  Never done  . INFLUENZA VACCINE  01/02/2021 (Originally 04/04/2020)  . COVID-19 Vaccine (3 - Booster for Pfizer series) 12/13/2020  . HEMOGLOBIN A1C  04/11/2021  . TETANUS/TDAP  10/05/2021  . HIV Screening  Completed  . HPV VACCINES  Aged Out     ----------------------------------------------------------------------------------------------------------------------------------------------------------------------------------------------------------------- Physical Exam BP (!) 168/90 (BP Location: Left Arm, Patient Position: Sitting, Cuff Size: Large)   Pulse 97   Temp 97.7 F (36.5 C)   Wt 210 lb 6.4 oz (95.4  kg)   SpO2 100%   BMI 28.54 kg/m   Physical Exam Constitutional:      Appearance: Normal appearance.  Eyes:     General: No scleral icterus. Cardiovascular:     Rate and Rhythm: Normal rate and regular rhythm.  Pulmonary:     Effort: Pulmonary effort is normal.     Breath sounds: Normal breath sounds.  Musculoskeletal:     Cervical back: Neck supple.  Skin:    General: Skin is warm and dry.  Neurological:     General: No focal deficit present.     Mental Status: He is alert.      ------------------------------------------------------------------------------------------------------------------------------------------------------------------------------------------------------------------- Assessment and Plan  Diabetes mellitus type 1.5, managed as type 1 (HCC) Diabetes has been poorly controlled historically. His diet is contributing significantly to poor control.  Discussed reading food labels and counting carbohydrates.  Discussed avoiding concentrated sweets such as fruit juices and candy. Declines referral to nutritionist/diabetes educator.    He will continue insulin at current strength and dosing.    Dyslipidemia He will pick up rosuvastatin and start.    Essential hypertension BP elevated today.  Rx for enalapril much cheaper at [facility] with [facility] card.   Recommend low sodium diet as well.    Meds ordered this encounter  Medications  . enalapril (VASOTEC) 5 MG tablet    Sig: Take 1qd    Dispense:  30 tablet    Refill:  1  . rosuvastatin (CRESTOR) 20 MG tablet    Sig: Take 1 tablet (20 mg total) by mouth daily.    Dispense:  30 tablet    Refill:  3  . insulin aspart protamine - aspart (NOVOLOG MIX 70/30 FLEXPEN) (70-30) 100 UNIT/ML FlexPen    Sig: Inject 0.35 mLs (35 Units total) into the skin 2 (two) times daily with a meal.    Dispense:  21 mL    Refill:  3    Return in about 2 months (around 01/16/2021) for HTN/DM.    This visit occurred during the SARS-CoV-2 public health emergency.  Safety protocols were in place, including screening questions prior to the visit, additional usage of staff PPE, and extensive cleaning of exam room while observing appropriate contact time as indicated for disinfecting solutions.

## 2020-11-16 NOTE — Assessment & Plan Note (Signed)
BP elevated today.  Rx for enalapril much cheaper at [facility] with GoodRx card.   Recommend low sodium diet as well.

## 2021-01-17 ENCOUNTER — Ambulatory Visit: Payer: PRIVATE HEALTH INSURANCE | Admitting: Family Medicine

## 2022-12-12 ENCOUNTER — Encounter: Payer: Self-pay | Admitting: Family Medicine

## 2022-12-12 ENCOUNTER — Ambulatory Visit (INDEPENDENT_AMBULATORY_CARE_PROVIDER_SITE_OTHER): Payer: BC Managed Care – PPO | Admitting: Family Medicine

## 2022-12-12 VITALS — BP 171/104 | HR 90 | Ht 72.0 in | Wt 211.0 lb

## 2022-12-12 DIAGNOSIS — E785 Hyperlipidemia, unspecified: Secondary | ICD-10-CM | POA: Diagnosis not present

## 2022-12-12 DIAGNOSIS — I10 Essential (primary) hypertension: Secondary | ICD-10-CM | POA: Diagnosis not present

## 2022-12-12 DIAGNOSIS — E139 Other specified diabetes mellitus without complications: Secondary | ICD-10-CM

## 2022-12-12 LAB — POCT GLYCOSYLATED HEMOGLOBIN (HGB A1C): Hemoglobin A1C: 13 % — AB (ref 4.0–5.6)

## 2022-12-12 MED ORDER — BD PEN NEEDLE MINI U/F 31G X 5 MM MISC: 2 refills | Status: AC

## 2022-12-12 MED ORDER — TOUJEO SOLOSTAR 300 UNIT/ML ~~LOC~~ SOPN: PEN_INJECTOR | SUBCUTANEOUS | 3 refills | Status: AC

## 2022-12-12 MED ORDER — ONETOUCH VERIO IQ SYSTEM W/DEVICE KIT: PACK | 0 refills | Status: AC

## 2022-12-12 MED ORDER — ONETOUCH VERIO VI STRP: ORAL_STRIP | 3 refills | Status: AC

## 2022-12-12 MED ORDER — ENALAPRIL MALEATE 5 MG PO TABS: ORAL_TABLET | ORAL | 1 refills | Status: AC

## 2022-12-12 NOTE — Assessment & Plan Note (Signed)
Blood pressure is elevated.  Restarting enalapril.  Follow-up then approximate 2 weeks for blood pressure recheck.  I will see him again in about 4 to 6 weeks.

## 2022-12-12 NOTE — Patient Instructions (Addendum)
I can fill the paperwork out but it will show that your conditions are not managed/well controlled.   Your A1c is 13.0% and your blood pressure is not well controlled.   I have sent over a prescription for basaglar insulin.  You will inject 25 units daily. I have also sent over a glucose meter. We'll need to follow up in about 6 weeks.  Discontinue the 70/30 insulin  Restart enalapril for blood pressure.

## 2022-12-12 NOTE — Assessment & Plan Note (Addendum)
Poorly controlled diabetes.  We discussed dietary changes to help with his blood sugar.  Adding Toujeo 25 units daily.  Prescription for glucometer sent in to monitor blood sugar.  Asked him to bring this back to review when he returns in a few weeks.  In regards to his DMV paperwork I recommended that he request an extension on this until his conditions are under better control.  I discussed him that if I fill out paperwork at this time it will reflect his poorly controlled medical conditions as well as poor compliance with treatment.  He insisted that I go ahead and complete this.

## 2022-12-12 NOTE — Progress Notes (Signed)
Christopher Horne - 52 y.o. male MRN [id number]  Date of birth: October 14, 1970  Subjective Chief Complaint  Patient presents with   Diabetes   Hypertension    HPI Christopher Horne is a 52 year old male here today for follow-up visit.  History of poorly controlled hypertension and diabetes.  He has been poorly compliant with follow-up visits as well as use of medications regularly.  His last visit with me was in 2022.  He does need updated DMV forms completed in regards to his diabetes and hypertension.  He has been purchasing Novolin R over-the-counter for treatment of his diabetes.  He is unsure how many units he is using at this time.  He has recently acquired insurance and would like to have insulin prescribed.  He has had several hospitalizations in the past related to DKA.  Denies significant hypoglycemia.  ROS:  A comprehensive ROS was completed and negative except as noted per HPI  No Known Allergies  Past Medical History:  Diagnosis Date   Diabetes mellitus without complication    Hyperlipidemia    Thyroid disease     Past Surgical History:  Procedure Laterality Date   EYE SURGERY      Social History   Socioeconomic History   Marital status: Legally Separated    Spouse name: Not on file   Number of children: 0   Years of education: 14   Highest education level: Not on file  Occupational History   Occupation: Fabricator  Tobacco Use   Smoking status: Never   Smokeless tobacco: Never  Vaping Use   Vaping Use: Never used  Substance and Sexual Activity   Alcohol use: Yes    Alcohol/week: 3.0 standard drinks of alcohol    Types: 3 Cans of beer per week    Comment: weekends   Drug use: No   Sexual activity: Yes  Other Topics Concern   Not on file  Social History Narrative   Currently lives with his wife. Fun: Go to the beach   Denies religious beliefs effecting health care.    Social Determinants of Health   Financial Resource Strain: Not on file  Food  Insecurity: Not on file  Transportation Needs: Not on file  Physical Activity: Not on file  Stress: Not on file  Social Connections: Not on file    Family History  Problem Relation Age of Onset   Healthy Mother    Diabetes Maternal Grandmother    Diabetes Paternal Grandmother    Diabetes Maternal Aunt     Health Maintenance  Topic Date Due   DTaP/Tdap/Td (1 - Tdap) Never done   Diabetic kidney evaluation - Urine ACR  05/08/2020   Diabetic kidney evaluation - eGFR measurement  10/12/2021   FOOT EXAM  06/13/2023 (Originally 11/16/2021)   OPHTHALMOLOGY EXAM  06/13/2023 (Originally 07/22/2015)   COVID-19 Vaccine (3 - 2023-24 season) 06/29/2023 (Originally 05/05/2022)   Hepatitis C Screening  12/12/2023 (Originally 03/18/1991)   COLONOSCOPY (Pts 45-75yrs Insurance coverage will need to be confirmed)  06/12/2024 (Originally 03/17/2018)   INFLUENZA VACCINE  04/05/2023   HEMOGLOBIN A1C  06/13/2023   HIV Screening  Completed   HPV VACCINES  Aged Out     ----------------------------------------------------------------------------------------------------------------------------------------------------------------------------------------------------------------- Physical Exam BP (!) 171/104 (BP Location: Left Arm, Patient Position: Sitting, Cuff Size: Large)   Pulse 90   Ht 6' (1.829 m)   Wt 211 lb (95.7 kg)   SpO2 98%   BMI 28.62 kg/m   Physical Exam Constitutional:  Appearance: Normal appearance.  HENT:     Head: Normocephalic and atraumatic.  Eyes:     General: No scleral icterus. Cardiovascular:     Rate and Rhythm: Normal rate and regular rhythm.  Pulmonary:     Effort: Pulmonary effort is normal.     Breath sounds: Normal breath sounds.  Musculoskeletal:     Cervical back: Neck supple.  Neurological:     Mental Status: He is alert.  Psychiatric:        Mood and Affect: Mood normal.        Behavior: Behavior normal.      ------------------------------------------------------------------------------------------------------------------------------------------------------------------------------------------------------------------- Assessment and Plan  Essential hypertension Blood pressure is elevated.  Restarting enalapril.  Follow-up then approximate 2 weeks for blood pressure recheck.  I will see him again in about 4 to 6 weeks.  Diabetes mellitus type 1.5, managed as type 1 (HCC) Poorly controlled diabetes.  We discussed dietary changes to help with his blood sugar.  Adding Toujeo 25 units daily.  Prescription for glucometer sent in to monitor blood sugar.  Asked him to bring this back to review when he returns in a few weeks.  In regards to his DMV paperwork I recommended that he request an extension on this until his conditions are under better control.  I discussed him that if I fill out paperwork at this time it will reflect his poorly controlled medical conditions as well as poor compliance with treatment.  He insisted that I go ahead and complete this.   Meds ordered this encounter  Medications   enalapril (VASOTEC) 5 MG tablet    Sig: Take 1qd    Dispense:  90 tablet    Refill:  1   glucose blood (ONETOUCH VERIO) test strip    Sig: Use as instructed to check blood sugar 3 times per day dx code    Dispense:  100 each    Refill:  3   Blood Glucose Monitoring Suppl (ONETOUCH VERIO IQ SYSTEM) w/Device KIT    Sig: Use to check glucose daily    Dispense:  1 kit    Refill:  0   insulin glargine, 1 Unit Dial, (TOUJEO SOLOSTAR) 300 UNIT/ML Solostar Pen    Sig: Inject 25 units daily    Dispense:  4.5 mL    Refill:  3   Insulin Pen Needle (B-D UF III MINI PEN NEEDLES) 31G X 5 MM MISC    Sig: Use to inject insulin 2/day.    Dispense:  100 each    Refill:  2    Return in about 6 weeks (around 01/23/2023) for HTN/T2DM.    This visit occurred during the SARS-CoV-2 public health emergency.   Safety protocols were in place, including screening questions prior to the visit, additional usage of staff PPE, and extensive cleaning of exam room while observing appropriate contact time as indicated for disinfecting solutions.

## 2023-01-05 LAB — HM DIABETES EYE EXAM

## 2023-01-12 ENCOUNTER — Encounter: Payer: Self-pay | Admitting: Family Medicine

## 2023-01-23 ENCOUNTER — Ambulatory Visit: Payer: BC Managed Care – PPO | Admitting: Family Medicine

## 2023-04-25 ENCOUNTER — Telehealth: Payer: Self-pay

## 2023-04-25 NOTE — Transitions of Care (Post Inpatient/ED Visit) (Unsigned)
   04/25/2023  Name: Christopher Horne MRN: [id number] DOB: 1971/01/08  Today's TOC FU Call Status: Today's TOC FU Call Status:: Unsuccessful Call (1st Attempt) Unsuccessful Call (1st Attempt) Date: 04/25/23  Attempted to reach the patient regarding the most recent Inpatient/ED visit.  Follow Up Plan: Additional outreach attempts will be made to reach the patient to complete the Transitions of Care (Post Inpatient/ED visit) call.   Signature Karena Addison, LPN [facility] Nurse Health Advisor Direct Dial [phone]

## 2023-04-26 NOTE — Transitions of Care (Post Inpatient/ED Visit) (Signed)
04/26/2023  Name: Christopher Horne MRN: [id number] DOB: [birth year]  Today's TOC FU Call Status: Today's TOC FU Call Status:: Successful TOC FU Call Completed Unsuccessful Call (1st Attempt) Date: 04/25/23 [facility] FU Call Complete Date: 04/26/23  Transition Care Management Follow-up Telephone Call Date of Discharge: 04/24/23 Discharge Facility: Other ([facility]) Name of [facility] Type of Discharge: Inpatient Admission Primary Inpatient Discharge Diagnosis:: fracture femur How have you been since you were released from the hospital?: Better Any questions or concerns?: No  Items Reviewed: Did you receive and understand the discharge instructions provided?: No Medications obtained,verified, and reconciled?: Yes (Medications Reviewed) Any new allergies since your discharge?: No Dietary orders reviewed?: Yes Do you have support at home?: Yes People in Home: significant other  Medications Reviewed Today: Medications Reviewed Today     Reviewed by Karena Addison, LPN (Licensed Practical Nurse) on 04/26/23 at 1521  Med List Status: <None>   Medication Order Taking? Sig Documenting Provider Last Dose Status Informant  Blood Glucose Monitoring Suppl (ONETOUCH VERIO IQ SYSTEM) w/Device KIT [id number] No Use to check glucose daily  Patient not taking: Reported on 12/12/2022   Everrett Coombe, DO Not Taking Active   enalapril (VASOTEC) 5 MG tablet [id number]  Take 1qd Everrett Coombe, DO  Active   glucose blood (ONETOUCH VERIO) test strip [id number]  Use as instructed to check blood sugar 3 times per day dx code Everrett Coombe, DO  Active   insulin glargine, 1 Unit Dial, (TOUJEO SOLOSTAR) 300 UNIT/ML Solostar Pen [id number]  Inject 25 units daily Everrett Coombe, DO  Active   Insulin Pen Needle (B-D UF III MINI PEN NEEDLES) 31G X 5 MM MISC [id number]  Use to inject insulin 2/day. Everrett Coombe, DO  Active   Multiple Vitamin (MULTIVITAMIN WITH MINERALS) TABS  tablet [id number] No Take 1 tablet by mouth daily.  Patient not taking: Reported on 12/12/2022   Barnetta Chapel, MD Not Taking Active   [facility] LANCETS FINE [place] [id number] No Use to check blood sugar 3 times per day dx code E11.9  Patient not taking: Reported on 12/12/2022   Romero Belling, MD Not Taking Active Self  rosuvastatin (CRESTOR) 20 MG tablet [id number] No Take 1 tablet (20 mg total) by mouth daily.  Patient not taking: Reported on 12/12/2022   Everrett Coombe, DO Not Taking Active             Home Care and Equipment/Supplies: Were Home Health Services Ordered?: Yes Name of Home Health Agency:: unknown Has Agency set up a time to come to your home?: No Any new equipment or medical supplies ordered?: Yes Name of Medical supply agency?: unknown Were you able to get the equipment/medical supplies?: Yes Do you have any questions related to the use of the equipment/supplies?: No  Functional Questionnaire: Do you need assistance with bathing/showering or dressing?: Yes Do you need assistance with meal preparation?: Yes Do you need assistance with eating?: No Do you have difficulty maintaining continence: No Do you need assistance with getting out of bed/getting out of a chair/moving?: Yes Do you have difficulty managing or taking your medications?: No  Follow up appointments reviewed: PCP Follow-up appointment confirmed?: NA Specialist Hospital Follow-up appointment confirmed?: No Reason Specialist Follow-Up Not Confirmed: Patient has Specialist Provider Number and will Call for Appointment Do you need transportation to your follow-up appointment?: No Do you understand care options if your condition(s) worsen?: Yes-patient verbalized understanding    SIGNATURE Karena Addison, LPN [facility] Nurse Health  Chief Financial Officer [phone]

## 2023-08-11 LAB — EXTERNAL GENERIC LAB PROCEDURE: COLOGUARD: NEGATIVE

## 2023-08-11 LAB — COLOGUARD: COLOGUARD: NEGATIVE

## 2024-02-24 ENCOUNTER — Other Ambulatory Visit: Payer: Self-pay | Admitting: Family Medicine

## 2024-02-25 NOTE — Telephone Encounter (Signed)
 Pls contact pt to schedule appt with Dr. Alvia. Last seen 12/2022. Unable to refill medication without kept appt. Thx.

## 2024-06-26 ENCOUNTER — Telehealth: Payer: Self-pay

## 2024-06-26 NOTE — Transitions of Care (Post Inpatient/ED Visit) (Signed)
   06/26/2024  Name: Christopher Horne MRN: [id number] DOB: Mar 09, 1971  Today's TOC FU Call Status: Today's TOC FU Call Status:: Unsuccessful Call (1st Attempt) Unsuccessful Call (1st Attempt) Date: 06/26/24  Attempted to reach the patient regarding the most recent Inpatient/ED visit.  Follow Up Plan: Additional outreach attempts will be made to reach the patient to complete the Transitions of Care (Post Inpatient/ED visit) call.   Shona Prow RN, CCM [place]  VBCI-Population Health RN Care Manager [phone]

## 2024-06-27 ENCOUNTER — Telehealth: Payer: Self-pay

## 2024-06-27 NOTE — Transitions of Care (Post Inpatient/ED Visit) (Signed)
   06/27/2024  Name: Christopher Horne MRN: [id number] DOB: May 20, 1971  Today's TOC FU Call Status: Today's TOC FU Call Status:: Unsuccessful Call (2nd Attempt) Unsuccessful Call (2nd Attempt) Date: 06/27/24  Attempted to reach the patient regarding the most recent Inpatient/ED visit.  Follow Up Plan: Additional outreach attempts will be made to reach the patient to complete the Transitions of Care (Post Inpatient/ED visit) call.   Shona Prow RN, CCM [place]  VBCI-Population Health RN Care Manager [phone]

## 2024-06-30 ENCOUNTER — Telehealth: Payer: Self-pay

## 2024-06-30 NOTE — Transitions of Care (Post Inpatient/ED Visit) (Signed)
   06/30/2024  Name: Christopher Horne MRN: [id number] DOB: 1970-09-18  Today's TOC FU Call Status: Today's TOC FU Call Status:: Unsuccessful Call (3rd Attempt) Unsuccessful Call (3rd Attempt) Date: 06/30/24  Attempted to reach the patient regarding the most recent Inpatient/ED visit.  Follow Up Plan: No further outreach attempts will be made at this time. We have been unable to contact the patient. Shona Prow RN, CCM [place]  VBCI-Population Health RN Care Manager [phone]

## 2024-07-30 ENCOUNTER — Telehealth: Payer: Self-pay

## 2024-07-30 NOTE — Transitions of Care (Post Inpatient/ED Visit) (Signed)
   07/30/2024  Name: Crimson Beer MRN: [id number] DOB: 04/02/1971  Today's TOC FU Call Status: Today's TOC FU Call Status:: Unsuccessful Call (1st Attempt) Unsuccessful Call (1st Attempt) Date: 07/30/24  Attempted to reach the patient regarding the most recent Inpatient/ED visit.  Follow Up Plan: Additional outreach attempts will be made to reach the patient to complete the Transitions of Care (Post Inpatient/ED visit) call.   Shona Prow RN, CCM [place]  VBCI-Population Health RN Care Manager [phone]

## 2024-08-04 ENCOUNTER — Telehealth: Payer: Self-pay

## 2024-08-04 NOTE — Transitions of Care (Post Inpatient/ED Visit) (Signed)
   08/04/2024  Name: Christopher Horne MRN: [id number] DOB: [birth year]  Today's TOC FU Call Status: Today's TOC FU Call Status:: Unsuccessful Call (3rd Attempt) Unsuccessful Call (3rd Attempt) Date: 08/04/24  Attempted to reach the patient regarding the most recent Inpatient/ED visit.  Follow Up Plan: No further outreach attempts will be made at this time. We have been unable to contact the patient.  Shona Prow RN, CCM [place]  VBCI-Population Health RN Care Manager [phone]

## 2024-08-04 NOTE — Transitions of Care (Post Inpatient/ED Visit) (Signed)
   08/04/2024  Name: Christopher Horne MRN: [id number] DOB: [birth year]  Today's TOC FU Call Status: Today's TOC FU Call Status:: Unsuccessful Call (2nd Attempt) Unsuccessful Call (2nd Attempt) Date: 08/04/24  Attempted to reach the patient regarding the most recent Inpatient/ED visit.  Follow Up Plan: Additional outreach attempts will be made to reach the patient to complete the Transitions of Care (Post Inpatient/ED visit) call.   Shona Prow RN, CCM [place]  VBCI-Population Health RN Care Manager [phone]
